# Patient Record
Sex: Female | Born: 1965 | Race: Black or African American | Hispanic: No | Marital: Married | Smoking: Former smoker
Health system: Southern US, Community
[De-identification: ages and names within clinical notes are randomized; demographics above are authoritative.]

## PROBLEM LIST (undated history)

## (undated) DIAGNOSIS — F1910 Other psychoactive substance abuse, uncomplicated: Secondary | ICD-10-CM

## (undated) DIAGNOSIS — G4700 Insomnia, unspecified: Secondary | ICD-10-CM

## (undated) DIAGNOSIS — T7840XA Allergy, unspecified, initial encounter: Secondary | ICD-10-CM

## (undated) DIAGNOSIS — M1990 Unspecified osteoarthritis, unspecified site: Secondary | ICD-10-CM

## (undated) DIAGNOSIS — F1911 Other psychoactive substance abuse, in remission: Secondary | ICD-10-CM

## (undated) DIAGNOSIS — F419 Anxiety disorder, unspecified: Secondary | ICD-10-CM

## (undated) DIAGNOSIS — R011 Cardiac murmur, unspecified: Secondary | ICD-10-CM

## (undated) DIAGNOSIS — G709 Myoneural disorder, unspecified: Secondary | ICD-10-CM

## (undated) DIAGNOSIS — E079 Disorder of thyroid, unspecified: Secondary | ICD-10-CM

## (undated) DIAGNOSIS — F32A Depression, unspecified: Secondary | ICD-10-CM

## (undated) DIAGNOSIS — D649 Anemia, unspecified: Secondary | ICD-10-CM

## (undated) DIAGNOSIS — J45909 Unspecified asthma, uncomplicated: Secondary | ICD-10-CM

## (undated) DIAGNOSIS — K219 Gastro-esophageal reflux disease without esophagitis: Secondary | ICD-10-CM

## (undated) HISTORY — PX: OTHER SURGICAL HISTORY: SHX169

## (undated) HISTORY — DX: Anemia, unspecified: D64.9

## (undated) HISTORY — DX: Depression, unspecified: F32.A

## (undated) HISTORY — DX: Unspecified asthma, uncomplicated: J45.909

## (undated) HISTORY — DX: Cardiac murmur, unspecified: R01.1

## (undated) HISTORY — DX: Other psychoactive substance abuse, in remission: F19.11

## (undated) HISTORY — DX: Other psychoactive substance abuse, uncomplicated: F19.10

## (undated) HISTORY — PX: ABDOMINAL HYSTERECTOMY: SHX81

## (undated) HISTORY — DX: Morbid (severe) obesity due to excess calories: E66.01

## (undated) HISTORY — DX: Gastro-esophageal reflux disease without esophagitis: K21.9

## (undated) HISTORY — DX: Disorder of thyroid, unspecified: E07.9

## (undated) HISTORY — DX: Unspecified osteoarthritis, unspecified site: M19.90

## (undated) HISTORY — DX: Insomnia, unspecified: G47.00

## (undated) HISTORY — PX: PARTIAL HYSTERECTOMY: SHX80

## (undated) HISTORY — DX: Myoneural disorder, unspecified: G70.9

## (undated) HISTORY — DX: Allergy, unspecified, initial encounter: T78.40XA

## (undated) HISTORY — DX: Anxiety disorder, unspecified: F41.9

---

## 2015-11-21 HISTORY — PX: BREAST BIOPSY: SHX20

## 2018-10-16 LAB — BASIC METABOLIC PANEL: Glucose: 103

## 2019-09-08 LAB — HEMOGLOBIN A1C: Hemoglobin A1C: 5.7

## 2019-09-08 LAB — TSH: TSH: 1.84 (ref <=5.90)

## 2020-06-24 ENCOUNTER — Other Ambulatory Visit: Payer: Self-pay

## 2020-06-24 ENCOUNTER — Ambulatory Visit: Payer: 59 | Admitting: Internal Medicine

## 2020-06-24 ENCOUNTER — Encounter: Payer: Self-pay | Admitting: Internal Medicine

## 2020-06-24 DIAGNOSIS — F32A Depression, unspecified: Secondary | ICD-10-CM | POA: Insufficient documentation

## 2020-06-24 DIAGNOSIS — G4700 Insomnia, unspecified: Secondary | ICD-10-CM

## 2020-06-24 DIAGNOSIS — Z803 Family history of malignant neoplasm of breast: Secondary | ICD-10-CM

## 2020-06-24 DIAGNOSIS — Z780 Asymptomatic menopausal state: Secondary | ICD-10-CM

## 2020-06-24 DIAGNOSIS — Z800 Family history of malignant neoplasm of digestive organs: Secondary | ICD-10-CM | POA: Diagnosis not present

## 2020-06-24 DIAGNOSIS — F1911 Other psychoactive substance abuse, in remission: Secondary | ICD-10-CM

## 2020-06-24 DIAGNOSIS — F331 Major depressive disorder, recurrent, moderate: Secondary | ICD-10-CM

## 2020-06-24 MED ORDER — CITALOPRAM HYDROBROMIDE 20 MG PO TABS: 20.0000 mg | ORAL_TABLET | Freq: Every day | ORAL | 1 refills | Status: DC

## 2020-06-24 NOTE — Progress Notes (Signed)
New Patient Office Visit     This visit occurred during the SARS-CoV-2 public health emergency.  Safety protocols were in place, including screening questions prior to the visit, additional usage of staff PPE, and extensive cleaning of exam room while observing appropriate contact time as indicated for disinfecting solutions.    CC/Reason for Visit: Establish care, discuss chronic conditions Previous PCP: In [place] Last Visit: Unknown  HPI: Jill Davis is a 54 y.o. female who is coming in today for the above mentioned reasons. Past Medical History is significant for: Morbid obesity, depression on Celexa.  She is very interested in weight loss management/bariatric surgery referral today.  She is a recovering addict, quit all substances in 1990.  Her family history is very significant for diabetes and hypertension.  She had a sister with breast cancer and her father also had coronary artery disease.  She has received her Covid vaccines.  She is getting her Tdap and shingles at pharmacy next week.  She is overdue for colonoscopy and mammogram.  Also overdue for Pap smear.  She would like a refill of her Celexa today.  She is having difficulty sleeping, does not feel well rested throughout the day, has been told she snores.   Past Medical/Surgical History: Past Medical History:  Diagnosis Date  . Depression   . History of substance abuse ([place])   . Insomnia   . Morbid obesity ([place])     History reviewed. No pertinent surgical history.  Social History:  reports that she has quit smoking. She has never used smokeless tobacco. She reports current alcohol use. She reports current drug use. Drugs: Cocaine, Marijuana, "Crack" cocaine, and Heroin.  Allergies: Allergies  Allergen Reactions  . Celebrex [Celecoxib] Itching    Family History:  Family History  Problem Relation Age of Onset  . Diabetes Mother   . Hypertension Mother   . CAD Father   . Hypertension Father   . Breast  cancer Sister   . Hypertension Brother      Current Outpatient Medications:  .  citalopram (CELEXA) 20 MG tablet, Take 20 mg by mouth daily., Disp: , Rfl:   Review of Systems:  Constitutional: Denies fever, chills, diaphoresis, appetite change and fatigue.  HEENT: Denies photophobia, eye pain, redness, hearing loss, ear pain, congestion, sore throat, rhinorrhea, sneezing, mouth sores, trouble swallowing, neck pain, neck stiffness and tinnitus.   Respiratory: Denies SOB, DOE, cough, chest tightness,  and wheezing.   Cardiovascular: Denies chest pain, palpitations and leg swelling.  Gastrointestinal: Denies nausea, vomiting, abdominal pain, diarrhea, constipation, blood in stool and abdominal distention.  Genitourinary: Denies dysuria, urgency, frequency, hematuria, flank pain and difficulty urinating.  Endocrine: Denies: hot or cold intolerance, sweats, changes in hair or nails, polyuria, polydipsia. Musculoskeletal: Denies myalgias, back pain, joint swelling, arthralgias and gait problem.  Skin: Denies pallor, rash and wound.  Neurological: Denies dizziness, seizures, syncope, weakness, light-headedness, numbness and headaches.  Hematological: Denies adenopathy. Easy bruising, personal or family bleeding history  Psychiatric/Behavioral: Denies suicidal ideation, mood changes, confusion, nervousness, sleep disturbance and agitation    Physical Exam: Vitals:   06/24/20 0937  BP: 124/78  Temp: 98.2 F (36.8 C)  TempSrc: Oral  Weight: 297 lb 11.2 oz (135 kg)  Height: 5' 4.5" (1.638 m)   Body mass index is 50.31 kg/m.  Constitutional: NAD, calm, comfortable, obese Eyes: PERRL, lids and conjunctivae normal ENMT: Mucous membranes are moist.  Respiratory: clear to auscultation bilaterally, no wheezing, no crackles. Normal respiratory  effort. No accessory muscle use.  Cardiovascular: Regular rate and rhythm, no murmurs / rubs / gallops. No extremity edema. Neurologic: Grossly intact  and nonfocal Psychiatric: Normal judgment and insight. Alert and oriented x 3. Normal mood.    Impression and Plan:  Morbid obesity ([place])  -I believe she would be a good candidate for bariatric surgery, will place referral. -Discussed healthy lifestyle, including increased physical activity and better food choices to promote weight loss.  Family history of colon cancer  - Plan: Ambulatory referral to Gastroenterology  Family history of breast cancer  - Plan: MM Digital Screening  Insomnia, unspecified type  - Plan: Ambulatory referral to Neurology for sleep study.  Moderate episode of recurrent major depressive disorder (H[facility]) -Mood is stable, refill Celexa  History of substance abuse ([place]) -Quit 30 years ago.    Patient Instructions  -Nice seeing you today!!  -Schedule follow up for your physical. Please come in fasting that day.  -referrals for colonoscopy, mammogram, sleep study and bariatric clinic have been requested today.     [name], MD [facility]

## 2020-06-24 NOTE — Patient Instructions (Signed)
-  Nice seeing you today!!  -Schedule follow up for your physical. Please come in fasting that day.  -referrals for colonoscopy, mammogram, sleep study and bariatric clinic have been requested today.

## 2020-06-28 ENCOUNTER — Encounter: Payer: Self-pay | Admitting: Obstetrics and Gynecology

## 2020-06-30 ENCOUNTER — Encounter: Payer: Self-pay | Admitting: Gastroenterology

## 2020-06-30 ENCOUNTER — Other Ambulatory Visit: Payer: Self-pay | Admitting: Internal Medicine

## 2020-06-30 DIAGNOSIS — Z1231 Encounter for screening mammogram for malignant neoplasm of breast: Secondary | ICD-10-CM

## 2020-07-06 ENCOUNTER — Other Ambulatory Visit: Payer: Self-pay

## 2020-07-06 ENCOUNTER — Encounter: Payer: Self-pay | Admitting: Internal Medicine

## 2020-07-06 ENCOUNTER — Ambulatory Visit (INDEPENDENT_AMBULATORY_CARE_PROVIDER_SITE_OTHER): Payer: 59 | Admitting: Internal Medicine

## 2020-07-06 VITALS — BP 124/78 | HR 72 | Temp 98.1°F | Ht 64.5 in | Wt 295.4 lb

## 2020-07-06 DIAGNOSIS — Z0000 Encounter for general adult medical examination without abnormal findings: Secondary | ICD-10-CM

## 2020-07-06 DIAGNOSIS — F331 Major depressive disorder, recurrent, moderate: Secondary | ICD-10-CM

## 2020-07-06 NOTE — Progress Notes (Signed)
Established Patient Office Visit     This visit occurred during the SARS-CoV-2 public health emergency.  Safety protocols were in place, including screening questions prior to the visit, additional usage of staff PPE, and extensive cleaning of exam room while observing appropriate contact time as indicated for disinfecting solutions.    CC/Reason for Visit: Annual preventive exam  HPI: Jill Davis is a 54 y.o. female who is coming in today for the above mentioned reasons. Past Medical History is significant for: Depression on Celexa, morbid obesity.  She has routine eye care but no dental care.  Her immunizations are up-to-date, she has scheduled her GYN exam for later this week, mammogram for the end of the month and her colonoscopy scheduled for next month.  She is also scheduling her appointment with the bariatric clinic.  She has no acute complaints today.   Past Medical/Surgical History: Past Medical History:  Diagnosis Date  . Depression   . History of substance abuse ([place])   . Insomnia   . Morbid obesity ([place])     No past surgical history on file.  Social History:  reports that she has quit smoking. She has never used smokeless tobacco. She reports current alcohol use. She reports current drug use. Drugs: Cocaine, Marijuana, "Crack" cocaine, and Heroin.  Allergies: Allergies  Allergen Reactions  . Celebrex [Celecoxib] Itching    Family History:  Family History  Problem Relation Age of Onset  . Diabetes Mother   . Hypertension Mother   . CAD Father   . Hypertension Father   . Breast cancer Sister   . Hypertension Brother      Current Outpatient Medications:  .  citalopram (CELEXA) 20 MG tablet, Take 1 tablet (20 mg total) by mouth daily., Disp: 90 tablet, Rfl: 1  Review of Systems:  Constitutional: Denies fever, chills, diaphoresis, appetite change and fatigue.  HEENT: Denies photophobia, eye pain, redness, hearing loss, ear pain, congestion, sore  throat, rhinorrhea, sneezing, mouth sores, trouble swallowing, neck pain, neck stiffness and tinnitus.   Respiratory: Denies SOB, DOE, cough, chest tightness,  and wheezing.   Cardiovascular: Denies chest pain, palpitations and leg swelling.  Gastrointestinal: Denies nausea, vomiting, abdominal pain, diarrhea, constipation, blood in stool and abdominal distention.  Genitourinary: Denies dysuria, urgency, frequency, hematuria, flank pain and difficulty urinating.  Endocrine: Denies: hot or cold intolerance, sweats, changes in hair or nails, polyuria, polydipsia. Musculoskeletal: Denies myalgias, back pain, joint swelling, arthralgias and gait problem.  Skin: Denies pallor, rash and wound.  Neurological: Denies dizziness, seizures, syncope, weakness, light-headedness, numbness and headaches.  Hematological: Denies adenopathy. Easy bruising, personal or family bleeding history  Psychiatric/Behavioral: Denies suicidal ideation, mood changes, confusion, nervousness, sleep disturbance and agitation    Physical Exam: Vitals:   07/06/20 0706  BP: 124/78  Pulse: 72  Temp: 98.1 F (36.7 C)  TempSrc: Oral  SpO2: 95%  Weight: 295 lb 6.4 oz (134 kg)  Height: 5' 4.5" (1.638 m)    Body mass index is 49.92 kg/m.   Constitutional: NAD, calm, comfortable, obese Eyes: PERRL, lids and conjunctivae normal ENMT: Mucous membranes are moist.Tympanic membrane is pearly white, no erythema or bulging. Neck: normal, supple, no masses, no thyromegaly Respiratory: clear to auscultation bilaterally, no wheezing, no crackles. Normal respiratory effort. No accessory muscle use.  Cardiovascular: Regular rate and rhythm, no murmurs / rubs / gallops. No extremity edema. 2+ pedal pulses.  Abdomen: no tenderness, no masses palpated. No hepatosplenomegaly. Bowel sounds positive.  Musculoskeletal:  no clubbing / cyanosis. No joint deformity upper and lower extremities. Good ROM, no contractures. Normal muscle tone.    Skin: no rashes, lesions, ulcers. No induration Neurologic: CN 2-12 grossly intact. Sensation intact, DTR normal. Strength 5/5 in all 4.  Psychiatric: Normal judgment and insight. Alert and oriented x 3. Normal mood.    Impression and Plan:  Encounter for preventive health examination  -She has routine eye care, have advised routine dental care. -Immunizations are up-to-date. -Healthy lifestyle discussed in detail. -Screening labs today. -Her colonoscopy, mammogram, and GYN evaluation are scheduled for later this month.  Morbid obesity ([place]) -Discussed healthy lifestyle, including increased physical activity and better food choices to promote weight loss.  Moderate episode of recurrent major depressive disorder ([place]) -   Office Visit from 07/06/2020 in [place] at [place]  PHQ-9 Total Score 4     -Mood is stable on Celexa.    Patient Instructions  -Nice seeing you today!!  -Lab work today; will notify you once results are available.  -See you back in 1 year or sooner as needed.   Preventive Care 33-45 Years Old, Female Preventive care refers to visits with your health care provider and lifestyle choices that can promote health and wellness. This includes:  A yearly physical exam. This may also be called an annual well check.  Regular dental visits and eye exams.  Immunizations.  Screening for certain conditions.  Healthy lifestyle choices, such as eating a healthy diet, getting regular exercise, not using drugs or products that contain nicotine and tobacco, and limiting alcohol use. What can I expect for my preventive care visit? Physical exam Your health care provider will check your:  Height and weight. This may be used to calculate body mass index (BMI), which tells if you are at a healthy weight.  Heart rate and blood pressure.  Skin for abnormal spots. Counseling Your health care provider may ask you questions about your:  Alcohol,  tobacco, and drug use.  Emotional well-being.  Home and relationship well-being.  Sexual activity.  Eating habits.  Work and work Statistician.  Method of birth control.  Menstrual cycle.  Pregnancy history. What immunizations do I need?  Influenza (flu) vaccine  This is recommended every year. Tetanus, diphtheria, and pertussis (Tdap) vaccine  You may need a Td booster every 10 years. Varicella (chickenpox) vaccine  You may need this if you have not been vaccinated. Zoster (shingles) vaccine  You may need this after age 62. Measles, mumps, and rubella (MMR) vaccine  You may need at least one dose of MMR if you were born in 1957 or later. You may also need a second dose. Pneumococcal conjugate (PCV13) vaccine  You may need this if you have certain conditions and were not previously vaccinated. Pneumococcal polysaccharide (PPSV23) vaccine  You may need one or two doses if you smoke cigarettes or if you have certain conditions. Meningococcal conjugate (MenACWY) vaccine  You may need this if you have certain conditions. Hepatitis A vaccine  You may need this if you have certain conditions or if you travel or work in places where you may be exposed to hepatitis A. Hepatitis B vaccine  You may need this if you have certain conditions or if you travel or work in places where you may be exposed to hepatitis B. Haemophilus influenzae type b (Hib) vaccine  You may need this if you have certain conditions. Human papillomavirus (HPV) vaccine  If recommended by your health care provider, you  may need three doses over 6 months. You may receive vaccines as individual doses or as more than one vaccine together in one shot (combination vaccines). Talk with your health care provider about the risks and benefits of combination vaccines. What tests do I need? Blood tests  Lipid and cholesterol levels. These may be checked every 5 years, or more frequently if you are over 56  years old.  Hepatitis C test.  Hepatitis B test. Screening  Lung cancer screening. You may have this screening every year starting at age 86 if you have a 30-pack-year history of smoking and currently smoke or have quit within the past 15 years.  Colorectal cancer screening. All adults should have this screening starting at age 31 and continuing until age 33. Your health care provider may recommend screening at age 40 if you are at increased risk. You will have tests every 1-10 years, depending on your results and the type of screening test.  Diabetes screening. This is done by checking your blood sugar (glucose) after you have not eaten for a while (fasting). You may have this done every 1-3 years.  Mammogram. This may be done every 1-2 years. Talk with your health care provider about when you should start having regular mammograms. This may depend on whether you have a family history of breast cancer.  BRCA-related cancer screening. This may be done if you have a family history of breast, ovarian, tubal, or peritoneal cancers.  Pelvic exam and Pap test. This may be done every 3 years starting at age 25. Starting at age 75, this may be done every 5 years if you have a Pap test in combination with an HPV test. Other tests  Sexually transmitted disease (STD) testing.  Bone density scan. This is done to screen for osteoporosis. You may have this scan if you are at high risk for osteoporosis. Follow these instructions at home: Eating and drinking  Eat a diet that includes fresh fruits and vegetables, whole grains, lean protein, and low-fat dairy.  Take vitamin and mineral supplements as recommended by your health care provider.  Do not drink alcohol if: ? Your health care provider tells you not to drink. ? You are pregnant, may be pregnant, or are planning to become pregnant.  If you drink alcohol: ? Limit how much you have to 0-1 drink a day. ? Be aware of how much alcohol is in  your drink. In the [place] one drink equals one 12 oz bottle of beer (355 mL), one 5 oz glass of wine (148 mL), or one 1 oz glass of hard liquor (44 mL). Lifestyle  Take daily care of your teeth and gums.  Stay active. Exercise for at least 30 minutes on 5 or more days each week.  Do not use any products that contain nicotine or tobacco, such as cigarettes, e-cigarettes, and chewing tobacco. If you need help quitting, ask your health care provider.  If you are sexually active, practice safe sex. Use a condom or other form of birth control (contraception) in order to prevent pregnancy and STIs (sexually transmitted infections).  If told by your health care provider, take low-dose aspirin daily starting at age 55. What's next?  Visit your health care provider once a year for a well check visit.  Ask your health care provider how often you should have your eyes and teeth checked.  Stay up to date on all vaccines. This information is not intended to replace advice given to you  by your health care provider. Make sure you discuss any questions you have with your health care provider. Document Revised: 07/18/2018 Document Reviewed: 07/18/2018 Elsevier Patient Education  2020 [place], MD [facility]

## 2020-07-06 NOTE — Patient Instructions (Signed)
-Nice seeing you today!!  -Lab work today; will notify you once results are available.  -See you back in 1 year or sooner as needed.   Preventive Care 55-53 Years Old, Female Preventive care refers to visits with your health care provider and lifestyle choices that can promote health and wellness. This includes:  A yearly physical exam. This may also be called an annual well check.  Regular dental visits and eye exams.  Immunizations.  Screening for certain conditions.  Healthy lifestyle choices, such as eating a healthy diet, getting regular exercise, not using drugs or products that contain nicotine and tobacco, and limiting alcohol use. What can I expect for my preventive care visit? Physical exam Your health care provider will check your:  Height and weight. This may be used to calculate body mass index (BMI), which tells if you are at a healthy weight.  Heart rate and blood pressure.  Skin for abnormal spots. Counseling Your health care provider may ask you questions about your:  Alcohol, tobacco, and drug use.  Emotional well-being.  Home and relationship well-being.  Sexual activity.  Eating habits.  Work and work Statistician.  Method of birth control.  Menstrual cycle.  Pregnancy history. What immunizations do I need?  Influenza (flu) vaccine  This is recommended every year. Tetanus, diphtheria, and pertussis (Tdap) vaccine  You may need a Td booster every 10 years. Varicella (chickenpox) vaccine  You may need this if you have not been vaccinated. Zoster (shingles) vaccine  You may need this after age 84. Measles, mumps, and rubella (MMR) vaccine  You may need at least one dose of MMR if you were born in 1957 or later. You may also need a second dose. Pneumococcal conjugate (PCV13) vaccine  You may need this if you have certain conditions and were not previously vaccinated. Pneumococcal polysaccharide (PPSV23) vaccine  You may need one or  two doses if you smoke cigarettes or if you have certain conditions. Meningococcal conjugate (MenACWY) vaccine  You may need this if you have certain conditions. Hepatitis A vaccine  You may need this if you have certain conditions or if you travel or work in places where you may be exposed to hepatitis A. Hepatitis B vaccine  You may need this if you have certain conditions or if you travel or work in places where you may be exposed to hepatitis B. Haemophilus influenzae type b (Hib) vaccine  You may need this if you have certain conditions. Human papillomavirus (HPV) vaccine  If recommended by your health care provider, you may need three doses over 6 months. You may receive vaccines as individual doses or as more than one vaccine together in one shot (combination vaccines). Talk with your health care provider about the risks and benefits of combination vaccines. What tests do I need? Blood tests  Lipid and cholesterol levels. These may be checked every 5 years, or more frequently if you are over 84 years old.  Hepatitis C test.  Hepatitis B test. Screening  Lung cancer screening. You may have this screening every year starting at age 53 if you have a 30-pack-year history of smoking and currently smoke or have quit within the past 15 years.  Colorectal cancer screening. All adults should have this screening starting at age 41 and continuing until age 83. Your health care provider may recommend screening at age 36 if you are at increased risk. You will have tests every 1-10 years, depending on your results and the type  of screening test.  Diabetes screening. This is done by checking your blood sugar (glucose) after you have not eaten for a while (fasting). You may have this done every 1-3 years.  Mammogram. This may be done every 1-2 years. Talk with your health care provider about when you should start having regular mammograms. This may depend on whether you have a family history  of breast cancer.  BRCA-related cancer screening. This may be done if you have a family history of breast, ovarian, tubal, or peritoneal cancers.  Pelvic exam and Pap test. This may be done every 3 years starting at age 21. Starting at age 30, this may be done every 5 years if you have a Pap test in combination with an HPV test. Other tests  Sexually transmitted disease (STD) testing.  Bone density scan. This is done to screen for osteoporosis. You may have this scan if you are at high risk for osteoporosis. Follow these instructions at home: Eating and drinking  Eat a diet that includes fresh fruits and vegetables, whole grains, lean protein, and low-fat dairy.  Take vitamin and mineral supplements as recommended by your health care provider.  Do not drink alcohol if: ? Your health care provider tells you not to drink. ? You are pregnant, may be pregnant, or are planning to become pregnant.  If you drink alcohol: ? Limit how much you have to 0-1 drink a day. ? Be aware of how much alcohol is in your drink. In the [place] one drink equals one 12 oz bottle of beer (355 mL), one 5 oz glass of wine (148 mL), or one 1 oz glass of hard liquor (44 mL). Lifestyle  Take daily care of your teeth and gums.  Stay active. Exercise for at least 30 minutes on 5 or more days each week.  Do not use any products that contain nicotine or tobacco, such as cigarettes, e-cigarettes, and chewing tobacco. If you need help quitting, ask your health care provider.  If you are sexually active, practice safe sex. Use a condom or other form of birth control (contraception) in order to prevent pregnancy and STIs (sexually transmitted infections).  If told by your health care provider, take low-dose aspirin daily starting at age 50. What's next?  Visit your health care provider once a year for a well check visit.  Ask your health care provider how often you should have your eyes and teeth checked.  Stay up  to date on all vaccines. This information is not intended to replace advice given to you by your health care provider. Make sure you discuss any questions you have with your health care provider. Document Revised: 07/18/2018 Document Reviewed: 07/18/2018 Elsevier Patient Education  2020 [employer] Inc[employer]  

## 2020-07-07 ENCOUNTER — Encounter: Payer: Self-pay | Admitting: Internal Medicine

## 2020-07-07 LAB — VITAMIN B12: Vitamin B-12: 384 pg/mL (ref 200–1100)

## 2020-07-07 LAB — CBC WITH DIFFERENTIAL/PLATELET
Absolute Monocytes: 398 cells/uL (ref 200–950)
Basophils Absolute: 49 cells/uL (ref 0–200)
Basophils Relative: 0.5 %
Eosinophils Absolute: 126 cells/uL (ref 15–500)
Eosinophils Relative: 1.3 %
HCT: 39.1 % (ref 35.0–45.0)
Hemoglobin: 12.7 g/dL (ref 11.7–15.5)
Lymphs Abs: 2939 cells/uL (ref 850–3900)
MCH: 27.5 pg (ref 27.0–33.0)
MCHC: 32.5 g/dL (ref 32.0–36.0)
MCV: 84.6 fL (ref 80.0–100.0)
MPV: 10.3 fL (ref 7.5–12.5)
Monocytes Relative: 4.1 %
Neutro Abs: 6189 cells/uL (ref 1500–7800)
Neutrophils Relative %: 63.8 %
Platelets: 289 10*3/uL (ref 140–400)
RBC: 4.62 10*6/uL (ref 3.80–5.10)
RDW: 14.9 % (ref 11.0–15.0)
Total Lymphocyte: 30.3 %
WBC: 9.7 10*3/uL (ref 3.8–10.8)

## 2020-07-07 LAB — LIPID PANEL
Cholesterol: 118 mg/dL (ref <200)
HDL: 30 mg/dL — ABNORMAL LOW (ref >=50)
LDL Cholesterol (Calc): 69 mg/dL (calc)
Non-HDL Cholesterol (Calc): 88 mg/dL (calc) (ref <130)
Total CHOL/HDL Ratio: 3.9 (calc) (ref <5.0)
Triglycerides: 103 mg/dL (ref <150)

## 2020-07-07 LAB — COMPREHENSIVE METABOLIC PANEL
AG Ratio: 1.4 (calc) (ref 1.0–2.5)
ALT: 17 U/L (ref 6–29)
AST: 17 U/L (ref 10–35)
Albumin: 3.9 g/dL (ref 3.6–5.1)
Alkaline phosphatase (APISO): 122 U/L (ref 37–153)
BUN: 12 mg/dL (ref 7–25)
CO2: 25 mmol/L (ref 20–32)
Calcium: 9 mg/dL (ref 8.6–10.4)
Chloride: 105 mmol/L (ref 98–110)
Creat: 0.75 mg/dL (ref 0.50–1.05)
Globulin: 2.8 g/dL (calc) (ref 1.9–3.7)
Glucose, Bld: 103 mg/dL — ABNORMAL HIGH (ref 65–99)
Potassium: 4 mmol/L (ref 3.5–5.3)
Sodium: 140 mmol/L (ref 135–146)
Total Bilirubin: 0.4 mg/dL (ref 0.2–1.2)
Total Protein: 6.7 g/dL (ref 6.1–8.1)

## 2020-07-07 LAB — VITAMIN D 25 HYDROXY (VIT D DEFICIENCY, FRACTURES): Vit D, 25-Hydroxy: 30 ng/mL (ref 30–100)

## 2020-07-07 LAB — HEMOGLOBIN A1C
Hgb A1c MFr Bld: 5.9 % of total Hgb — ABNORMAL HIGH (ref <5.7)
Mean Plasma Glucose: 123 (calc)
eAG (mmol/L): 6.8 (calc)

## 2020-07-07 LAB — TSH: TSH: 2.27 mIU/L

## 2020-07-08 ENCOUNTER — Encounter: Payer: Self-pay | Admitting: Internal Medicine

## 2020-07-08 ENCOUNTER — Other Ambulatory Visit: Payer: Self-pay | Admitting: Internal Medicine

## 2020-07-08 ENCOUNTER — Telehealth: Payer: Self-pay | Admitting: Internal Medicine

## 2020-07-08 DIAGNOSIS — E559 Vitamin D deficiency, unspecified: Secondary | ICD-10-CM

## 2020-07-08 MED ORDER — VITAMIN D (ERGOCALCIFEROL) 1.25 MG (50000 UNIT) PO CAPS: 50000.0000 [IU] | ORAL_CAPSULE | ORAL | 0 refills | Status: AC

## 2020-07-08 NOTE — Telephone Encounter (Signed)
Pt called to say she doesn't remember what the medication she is picking up is for.  Said she was half sleep when she got the call to go pick it up. Wants a call back  [phone]  Please advise

## 2020-07-08 NOTE — Telephone Encounter (Signed)
Reviewed lab results with patient.

## 2020-07-09 ENCOUNTER — Ambulatory Visit: Payer: 59 | Admitting: Obstetrics and Gynecology

## 2020-07-19 ENCOUNTER — Encounter: Payer: Self-pay | Admitting: Internal Medicine

## 2020-07-20 ENCOUNTER — Other Ambulatory Visit: Payer: Self-pay

## 2020-07-20 ENCOUNTER — Encounter (HOSPITAL_COMMUNITY): Payer: Self-pay

## 2020-07-20 ENCOUNTER — Emergency Department (HOSPITAL_COMMUNITY)
Admission: EM | Admit: 2020-07-20 | Discharge: 2020-07-21 | Disposition: A | Discharge status: Home / Self Care | Payer: 59 | Attending: Emergency Medicine | Admitting: Emergency Medicine

## 2020-07-20 ENCOUNTER — Ambulatory Visit
Admission: RE | Admit: 2020-07-20 | Discharge: 2020-07-20 | Disposition: A | Discharge status: Home / Self Care | Payer: 59 | Source: Ambulatory Visit | Attending: Internal Medicine | Admitting: Internal Medicine

## 2020-07-20 DIAGNOSIS — Y999 Unspecified external cause status: Secondary | ICD-10-CM | POA: Insufficient documentation

## 2020-07-20 DIAGNOSIS — Y929 Unspecified place or not applicable: Secondary | ICD-10-CM | POA: Insufficient documentation

## 2020-07-20 DIAGNOSIS — Y9389 Activity, other specified: Secondary | ICD-10-CM | POA: Diagnosis not present

## 2020-07-20 DIAGNOSIS — Z87891 Personal history of nicotine dependence: Secondary | ICD-10-CM | POA: Insufficient documentation

## 2020-07-20 DIAGNOSIS — S70312A Abrasion, left thigh, initial encounter: Secondary | ICD-10-CM | POA: Diagnosis present

## 2020-07-20 DIAGNOSIS — Z79899 Other long term (current) drug therapy: Secondary | ICD-10-CM | POA: Insufficient documentation

## 2020-07-20 DIAGNOSIS — Z1231 Encounter for screening mammogram for malignant neoplasm of breast: Secondary | ICD-10-CM

## 2020-07-20 DIAGNOSIS — W5503XA Scratched by cat, initial encounter: Secondary | ICD-10-CM | POA: Diagnosis not present

## 2020-07-20 NOTE — ED Notes (Signed)
Pressure bandage applied to wound

## 2020-07-20 NOTE — ED Provider Notes (Signed)
[place] DEPT Provider Note   CSN: [id number] Arrival date & time: 07/20/20  2218     History No chief complaint on file.   Jill Davis is a 54 y.o. female.  HPI     This is a 54 year old female who presents with a cat scratch.  She states that she was trying to catch a moth when it landed on her leg.  Her cat bounced on her leg scratching her leg.  She was not bitten.  She noted significant bleeding of the left leg.  Her tetanus is up-to-date and her cats vaccinations are up-to-date.  She reports significant bleeding.  No significant pain.  Past Medical History:  Diagnosis Date  . Depression   . History of substance abuse ([place])   . Insomnia   . Morbid obesity [facility])     Patient Active Problem List   Diagnosis Date Noted  . Vitamin D deficiency 07/08/2020  . Morbid obesity ([place])   . Depression   . Insomnia   . History of substance abuse [facility])     Past Surgical History:  Procedure Laterality Date  . BREAST BIOPSY Left 11/21/2015   benign     OB History   No obstetric history on file.     Family History  Problem Relation Age of Onset  . Diabetes Mother   . Hypertension Mother   . CAD Father   . Hypertension Father   . Breast cancer Sister   . Hypertension Brother     Social History   Tobacco Use  . Smoking status: Former Research scientist (life sciences)  . Smokeless tobacco: Never Used  Substance Use Topics  . Alcohol use: Yes    Comment: occasional  . Drug use: Yes    Types: Cocaine, Marijuana, "Crack" cocaine, Heroin    Comment: quit 1990    Home Medications Prior to Admission medications   Medication Sig Start Date End Date Taking? Authorizing Provider  citalopram (CELEXA) 20 MG tablet Take 1 tablet (20 mg total) by mouth daily. 06/24/20   Isaac Bliss, Rayford Halsted, MD  Vitamin D, Ergocalciferol, (DRISDOL) 1.25 MG (50000 UNIT) CAPS capsule Take 1 capsule (50,000 Units total) by mouth every 7 (seven) days for 12 doses. 07/08/20 09/24/20   Erline Hau, MD    Allergies    Celebrex [celecoxib]  Review of Systems   Review of Systems  Constitutional: Negative for fever.  Skin: Positive for wound. Negative for color change.  All other systems reviewed and are negative.   Physical Exam Updated Vital Signs BP 128/77   Pulse 77   Temp 98.2 F (36.8 C) (Oral)   Resp 18   SpO2 97%   Physical Exam Vitals and nursing note reviewed.  Constitutional:      Appearance: She is well-developed. She is obese.  HENT:     Head: Normocephalic and atraumatic.     Nose: Nose normal.     Mouth/Throat:     Mouth: Mucous membranes are moist.  Eyes:     Pupils: Pupils are equal, round, and reactive to light.  Cardiovascular:     Rate and Rhythm: Normal rate and regular rhythm.  Pulmonary:     Effort: Pulmonary effort is normal. No respiratory distress.  Musculoskeletal:     Cervical back: Neck supple.     Right lower leg: No edema.     Left lower leg: No edema.  Skin:    General: Skin is warm and dry.  Comments: Punctate scratch noted over the left lateral thigh, no adjacent erythema, superficial, nonbleeding at this time although bandage noted to have a fair amount of blood on it  Neurological:     Mental Status: She is alert and oriented to person, place, and time.  Psychiatric:        Mood and Affect: Mood normal.     ED Results / Procedures / Treatments   Labs (all labs ordered are listed, but only abnormal results are displayed) Labs Reviewed - No data to display  EKG None  Radiology No results found.  Procedures Procedures (including critical care time)  Medications Ordered in ED Medications - No data to display  ED Course  I have reviewed the triage vital signs and the nursing notes.  Pertinent labs & imaging results that were available during my care of the patient were reviewed by me and considered in my medical decision making (see chart for details).    MDM  Rules/Calculators/A&P                           Patient presents with a scratch to the left thigh.  Reports significant bleeding.  Bleeding has stopped on my evaluation and actual wound is very small.  This was cleaned copiously.  Additional pressure dressing was applied as she is likely nicked a small vein.  We will keep that in place.  This will likely heal without incident.  Patient was informed that she could be at risk for cat scratch disease if she develops infectious symptoms.  After history, exam, and medical workup I feel the patient has been appropriately medically screened and is safe for discharge home. Pertinent diagnoses were discussed with the patient. Patient was given return precautions.   Final Clinical Impression(s) / ED Diagnoses Final diagnoses:  Cat scratch    Rx / DC Orders ED Discharge Orders    None       Nnaemeka Samson, Barbette Hair, MD 07/20/20 2350

## 2020-07-20 NOTE — ED Triage Notes (Signed)
Patient arrived stating her cat scratched her left leg prior to arrival. Reports cat is up to date on all shots and she and her tetanus shot about two weeks ago.

## 2020-07-20 NOTE — Discharge Instructions (Signed)
You were seen today after your cat scratched you.  This will likely heal well on its own.  Keep a compressive dressing on for the next 24 hours to prevent rebleeding.  If you notice redness, streaking, any signs or symptoms of infection, you may need an antibiotic.  Cat scratch can put you at risk for cat scratch disease.

## 2020-07-28 ENCOUNTER — Ambulatory Visit: Payer: 59 | Admitting: Obstetrics and Gynecology

## 2020-08-17 ENCOUNTER — Telehealth: Payer: Self-pay | Admitting: *Deleted

## 2020-08-17 ENCOUNTER — Other Ambulatory Visit: Payer: Self-pay

## 2020-08-17 ENCOUNTER — Ambulatory Visit (AMBULATORY_SURGERY_CENTER): Payer: 59 | Admitting: *Deleted

## 2020-08-17 VITALS — Ht 64.5 in | Wt 300.0 lb

## 2020-08-17 DIAGNOSIS — Z1211 Encounter for screening for malignant neoplasm of colon: Secondary | ICD-10-CM

## 2020-08-17 MED ORDER — SUTAB 1479-225-188 MG PO TABS: 24.0000 | ORAL_TABLET | ORAL | 0 refills | Status: DC

## 2020-08-17 NOTE — Progress Notes (Signed)
cov vax x 2 No egg or soy allergy known to patient  No issues with past sedation with any surgeries or procedures no intubation problems in the past  No FH of Malignant Hyperthermia No diet pills per patient No home 02 use per patient  No blood thinners per patient  Pt denies issues with constipation  No A fib or A flutter  EMMI video to pt or via [place] 19 guidelines implemented in PV today with Pt and RN   Pt's BMI at PV 50.70- did PV- note to Dr Tarri Glenn- ? Ov ? Direct at [place] given to pt in [place] today , Code to Pharmacy   Due to the COVID-19 pandemic we are asking patients to follow these guidelines. Please only bring one care partner. Please be aware that your care partner may wait in the car in the parking lot or if they feel like they will be too hot to wait in the car, they may wait in the lobby on the 4th floor. All care partners are required to wear a mask the entire time (we do not have any that we can provide them), they need to practice social distancing, and we will do a Covid check for all patient's and care partners when you arrive. Also we will check their temperature and your temperature. If the care partner waits in their car they need to stay in the parking lot the entire time and we will call them on their cell phone when the patient is ready for discharge so they can bring the car to the front of the building. Also all patient's will need to wear a mask into building.

## 2020-08-17 NOTE — Telephone Encounter (Signed)
Dr Tarri Glenn,  I saw thi spt in [place] today- she is a direct colon screen-  Her weight is 300.0ln abd her height is 5 4.5 inches - her BMI today is 50.70-  No previous colon- she has ahx of anx/ dep, Hx IDA, asthma, GERD, murmur as baby, hx of substance abuse ( clean > 30 yrs) thyroid dx   Do you want her to have an OV or can she be direct at [facility]   Please advise- thanks so much for your time, Marijean Niemann

## 2020-08-18 ENCOUNTER — Encounter: Payer: Self-pay | Admitting: Gastroenterology

## 2020-08-18 ENCOUNTER — Other Ambulatory Visit: Payer: Self-pay

## 2020-08-18 DIAGNOSIS — Z1211 Encounter for screening for malignant neoplasm of colon: Secondary | ICD-10-CM

## 2020-08-18 NOTE — Telephone Encounter (Signed)
Direct to [facility]. Thank you.

## 2020-08-18 NOTE — Telephone Encounter (Signed)
Pt scheduled for covid screen 10/01/20 at 9:45am, colon scheduled at [facility] 11/16@10 :30am. Is the pt expecting a call from you Jill Davis or do I need to call them.

## 2020-08-18 NOTE — Telephone Encounter (Signed)
Patient called and given [facility]  colonoscopy date and new instructions sent to mychart. Pt is aware.

## 2020-08-21 ENCOUNTER — Encounter: Payer: Self-pay | Admitting: Internal Medicine

## 2020-08-31 ENCOUNTER — Encounter: Payer: 59 | Admitting: Gastroenterology

## 2020-08-31 ENCOUNTER — Other Ambulatory Visit (HOSPITAL_COMMUNITY): Payer: 59

## 2020-09-23 ENCOUNTER — Encounter: Payer: Self-pay | Admitting: Internal Medicine

## 2020-10-01 ENCOUNTER — Other Ambulatory Visit (HOSPITAL_COMMUNITY)
Admission: RE | Admit: 2020-10-01 | Discharge: 2020-10-01 | Disposition: A | Discharge status: Home / Self Care | Payer: 59 | Source: Ambulatory Visit | Attending: Gastroenterology | Admitting: Gastroenterology

## 2020-10-01 DIAGNOSIS — Z01812 Encounter for preprocedural laboratory examination: Secondary | ICD-10-CM | POA: Diagnosis present

## 2020-10-01 DIAGNOSIS — Z20822 Contact with and (suspected) exposure to covid-19: Secondary | ICD-10-CM | POA: Insufficient documentation

## 2020-10-01 LAB — SARS CORONAVIRUS 2 (TAT 6-24 HRS): SARS Coronavirus 2: NEGATIVE

## 2020-10-05 ENCOUNTER — Other Ambulatory Visit: Payer: Self-pay

## 2020-10-05 ENCOUNTER — Ambulatory Visit (HOSPITAL_COMMUNITY)
Admission: RE | Admit: 2020-10-05 | Discharge: 2020-10-05 | Disposition: A | Discharge status: Home / Self Care | Payer: 59 | Attending: Gastroenterology | Admitting: Gastroenterology

## 2020-10-05 ENCOUNTER — Ambulatory Visit (HOSPITAL_COMMUNITY): Payer: 59 | Admitting: Anesthesiology

## 2020-10-05 ENCOUNTER — Encounter (HOSPITAL_COMMUNITY)
Admission: RE | Disposition: A | Discharge status: Home / Self Care | Payer: Self-pay | Source: Home / Self Care | Attending: Gastroenterology

## 2020-10-05 ENCOUNTER — Encounter (HOSPITAL_COMMUNITY): Payer: Self-pay | Admitting: Gastroenterology

## 2020-10-05 DIAGNOSIS — Z87891 Personal history of nicotine dependence: Secondary | ICD-10-CM | POA: Insufficient documentation

## 2020-10-05 DIAGNOSIS — Q438 Other specified congenital malformations of intestine: Secondary | ICD-10-CM | POA: Diagnosis not present

## 2020-10-05 DIAGNOSIS — K635 Polyp of colon: Secondary | ICD-10-CM

## 2020-10-05 DIAGNOSIS — Z1211 Encounter for screening for malignant neoplasm of colon: Secondary | ICD-10-CM | POA: Diagnosis present

## 2020-10-05 HISTORY — PX: COLONOSCOPY WITH PROPOFOL: SHX5780

## 2020-10-05 HISTORY — PX: POLYPECTOMY: SHX5525

## 2020-10-05 SURGERY — COLONOSCOPY WITH PROPOFOL
Anesthesia: Monitor Anesthesia Care

## 2020-10-05 MED ORDER — LACTATED RINGERS IV SOLN
INTRAVENOUS | Status: DC
  Administered 2020-10-05: 1000 mL via INTRAVENOUS

## 2020-10-05 MED ORDER — PROPOFOL 10 MG/ML IV BOLUS
INTRAVENOUS | Status: DC | PRN
  Administered 2020-10-05: 60 mg via INTRAVENOUS

## 2020-10-05 MED ORDER — PROPOFOL 500 MG/50ML IV EMUL
INTRAVENOUS | Status: DC | PRN
  Administered 2020-10-05: 140 ug/kg/min via INTRAVENOUS

## 2020-10-05 MED ORDER — SODIUM CHLORIDE 0.9 % IV SOLN: INTRAVENOUS | Status: DC

## 2020-10-05 SURGICAL SUPPLY — 21 items

## 2020-10-05 NOTE — Op Note (Signed)
[facility] Patient Name: Jill Davis Procedure Date: 10/05/2020 MRN: [id number] Attending MD: Thornton Park MD, MD Date of Birth: 25-May-1966 CSN: [id number] Age: 54 Admit Type: Outpatient Procedure:                Colonoscopy Indications:              Screening for colorectal malignant neoplasm, This                            is the patient's first colonoscopy                           No known family history of colon cancer or polyps Providers:                Thornton Park MD, MD, Glori Bickers, RN, Cherylynn Ridges, Technician, Stephanie [place] CRNA Referring MD:              Medicines:                Monitored Anesthesia Care Complications:            No immediate complications. Estimated blood loss:                            Minimal. Estimated Blood Loss:     Estimated blood loss was minimal. Procedure:                Pre-Anesthesia Assessment:                           - Prior to the procedure, a History and Physical                            was performed, and patient medications and                            allergies were reviewed. The patient's tolerance of                            previous anesthesia was also reviewed. The risks                            and benefits of the procedure and the sedation                            options and risks were discussed with the patient.                            All questions were answered, and informed consent                            was obtained. Prior Anticoagulants: The patient has  taken no previous anticoagulant or antiplatelet                            agents. ASA Grade Assessment: III - A patient with                            severe systemic disease. After reviewing the risks                            and benefits, the patient was deemed in                            satisfactory condition to undergo the procedure.                            After obtaining informed consent, the colonoscope                            was passed under direct vision. Throughout the                            procedure, the patient's blood pressure, pulse, and                            oxygen saturations were monitored continuously. The                            CF-HQ190L ([id number] Olympus colonoscope was                            introduced through the anus and advanced to the the                            cecum, identified by appendiceal orifice and                            ileocecal valve. The colonoscopy was performed with                            moderate difficulty due to a redundant colon and                            significant looping. Successful completion of the                            procedure was aided by applying abdominal pressure.                            The patient tolerated the procedure well. The                            quality of the bowel preparation was good. The  ileocecal valve, appendiceal orifice, and rectum                            were photographed. Scope In: 10:49:28 AM Scope Out: 11:04:43 AM Scope Withdrawal Time: 0 hours 11 minutes 13 seconds  Total Procedure Duration: 0 hours 15 minutes 15 seconds  Findings:      The perianal and digital rectal examinations were normal.      A 2 mm polyp was found in the proximal descending colon. The polyp was       sessile. The polyp was removed with a cold snare. Resection and       retrieval were complete. Estimated blood loss was minimal.      The exam was otherwise without abnormality on direct and retroflexion       views. Impression:               - One 2 mm polyp in the proximal descending colon,                            removed with a cold snare. Resected and retrieved.                           - The examination was otherwise normal on direct                            and retroflexion views. Moderate Sedation:       Not Applicable - Patient had care per Anesthesia. Recommendation:           - Patient has a contact number available for                            emergencies. The signs and symptoms of potential                            delayed complications were discussed with the                            patient. Return to normal activities tomorrow.                            Written discharge instructions were provided to the                            patient.                           - Resume previous diet.                           - Continue present medications.                           - Await pathology results.                           - Repeat colonoscopy date to be determined after  pending pathology results are reviewed for                            surveillance.                           - Emerging evidence supports eating a diet of                            fruits, vegetables, grains, calcium, and yogurt                            while reducing red meat and alcohol may reduce the                            risk of colon cancer.                           - Thank you for allowing me to be involved in your                            colon cancer prevention. Procedure Code(s):        --- Professional ---                           [phone], Colonoscopy, flexible; with removal of                            tumor(s), polyp(s), or other lesion(s) by snare                            technique Diagnosis Code(s):        --- Professional ---                           Z12.11, Encounter for screening for malignant                            neoplasm of colon                           K63.5, Polyp of colon CPT copyright 2019 American Medical Association. All rights reserved. The codes documented in this report are preliminary and upon coder review may  be revised to meet current compliance requirements. Thornton Park MD, MD 10/05/2020 11:09:10 AM This report has  been signed electronically. Number of Addenda: 0

## 2020-10-05 NOTE — Transfer of Care (Signed)
Immediate Anesthesia Transfer of Care Note  Patient: Jill Davis  Procedure(s) Performed: COLONOSCOPY WITH PROPOFOL (N/A ) POLYPECTOMY  Patient Location: PACU and Endoscopy Unit  Anesthesia Type:MAC  Level of Consciousness: awake and drowsy  Airway & Oxygen Therapy: Patient Spontanous Breathing  Post-op Assessment: Report given to RN and Post -op Vital signs reviewed and stable  Post vital signs: Reviewed and stable  Last Vitals:  Vitals Value Taken Time  BP 129/65 10/05/20 1110  Temp    Pulse 66 10/05/20 1111  Resp 19 10/05/20 1111  SpO2 95 % 10/05/20 1111  Vitals shown include unvalidated device data.  Last Pain:  Vitals:   10/05/20 1027  TempSrc: Oral  PainSc: 0-No pain         Complications: No complications documented.

## 2020-10-05 NOTE — Discharge Instructions (Signed)

## 2020-10-05 NOTE — Anesthesia Preprocedure Evaluation (Addendum)
Anesthesia Evaluation  Patient identified by MRN, date of birth, ID band Patient awake    Reviewed: Allergy & Precautions, H&P , NPO status , Patient's Chart, lab work & pertinent test results, reviewed documented beta blocker date and time   Airway Mallampati: I  TM Distance: >3 FB Neck ROM: full    Dental no notable dental hx. (+) Teeth Intact, Dental Advisory Given   Pulmonary neg pulmonary ROS, former smoker,    Pulmonary exam normal breath sounds clear to auscultation       Cardiovascular Exercise Tolerance: Good negative cardio ROS   Rhythm:regular Rate:Normal     Neuro/Psych PSYCHIATRIC DISORDERS Anxiety Depression  Neuromuscular disease    GI/Hepatic GERD  ,(+)     substance abuse  ,   Endo/Other  Morbid obesity  Renal/GU negative Renal ROS  negative genitourinary   Musculoskeletal   Abdominal (+) + obese,   Peds  Hematology negative hematology ROS (+)   Anesthesia Other Findings   Reproductive/Obstetrics negative OB ROS                            Anesthesia Physical Anesthesia Plan  ASA: III  Anesthesia Plan: MAC   Post-op Pain Management:    Induction: Intravenous  PONV Risk Score and Plan:   Airway Management Planned: Natural Airway, Nasal Cannula, Simple Face Mask and Mask  Additional Equipment:   Intra-op Plan:   Post-operative Plan: Extubation in OR  Informed Consent: I have reviewed the patients History and Physical, chart, labs and discussed the procedure including the risks, benefits and alternatives for the proposed anesthesia with the patient or authorized representative who has indicated his/her understanding and acceptance.     Dental Advisory Given  Plan Discussed with: CRNA and Anesthesiologist  Anesthesia Plan Comments: (  )        Anesthesia Quick Evaluation

## 2020-10-05 NOTE — H&P (Signed)
Referring Provider: No ref. provider found Primary Care Physician:  Isaac Bliss, Rayford Halsted, MD  Reason for Consultation:  Colon cancer screening   IMPRESSION:  Need for colon cancer screening No known family history of colon cancer or polyps  PLAN: Colonoscopy  HPI: Jill Davis is a 54 y.o. female who presents for screening colonoscopy. GI ROS is negative.   No known family history of colon cancer or polyps. No family history of uterine/endometrial cancer, pancreatic cancer or gastric/stomach cancer.   Past Medical History:  Diagnosis Date  . Allergy    grass   . Anemia   . Anxiety   . 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 Asthma    none since moved to [place]   . Depression   . GERD (gastroesophageal reflux disease)    PRn Rolaids -  gets with red sauces etc   . Heart murmur    as baby   . History of substance abuse ([place])   . Insomnia   . Morbid obesity ([place])   . Neuromuscular disorder ([facility])    plantar fascitis   . Substance abuse ([place])   . Thyroid disease    currently no meds     Past Surgical History:  Procedure Laterality Date  . ABDOMINAL HYSTERECTOMY    . BREAST BIOPSY Left 11/21/2015   benign  . cyst on buttocks     . PARTIAL HYSTERECTOMY     prior to abd full hysterectomy    Current Facility-Administered Medications  Medication Dose Route Frequency Provider Last Rate Last Admin  . 0.9 %  sodium chloride infusion   Intravenous Continuous Thornton Park, MD      . lactated ringers infusion   Intravenous Continuous Thornton Park, MD 10 mL/hr at 10/05/20 1037 1,000 mL at 10/05/20 1037    Allergies as of 08/18/2020 - Review Complete 08/17/2020  Allergen Reaction Noted  . Celebrex [celecoxib] Itching 06/24/2020    Family History  Problem Relation Age of Onset  . Diabetes Mother   . Hypertension Mother   . CAD Father   . Hypertension Father   . Breast cancer Sister   . Hypertension Brother   . Colon cancer Neg Hx   . Colon  polyps Neg Hx   . Esophageal cancer Neg Hx   . Stomach cancer Neg Hx   . Rectal cancer Neg Hx     Social History   Socioeconomic History  . Marital status: Legally Separated    Spouse name: Not on file  . Number of children: Not on file  . Years of education: Not on file  . Highest education level: Not on file  Occupational History  . Not on file  Tobacco Use  . Smoking status: Former Research scientist (life sciences)  . Smokeless tobacco: Never Used  Substance and Sexual Activity  . Alcohol use: Not Currently  . Drug use: Yes    Types: Cocaine, Marijuana, "Crack" cocaine, Heroin    Comment: quit 1990  . Sexual activity: Not on file  Other Topics Concern  . Not on file  Social History Narrative  . Not on file   Social Determinants of Health   Financial Resource Strain:   . Difficulty of Paying Living Expenses: Not on file  Food Insecurity:   . Worried About Charity fundraiser in the Last Year: Not on file  . Ran Out of Food in the Last Year: Not on file  Transportation  Needs:   . Lack of Transportation (Medical): Not on file  . Lack of Transportation (Non-Medical): Not on file  Physical Activity:   . Days of Exercise per Week: Not on file  . Minutes of Exercise per Session: Not on file  Stress:   . Feeling of Stress : Not on file  Social Connections:   . Frequency of Communication with Friends and Family: Not on file  . Frequency of Social Gatherings with Friends and Family: Not on file  . Attends Religious Services: Not on file  . Active Member of Clubs or Organizations: Not on file  . Attends Archivist Meetings: Not on file  . Marital Status: Not on file  Intimate Partner Violence:   . Fear of Current or Ex-Partner: Not on file  . Emotionally Abused: Not on file  . Physically Abused: Not on file  . Sexually Abused: Not on file    Review of Systems: 12 system ROS is negative except as noted above.   Physical Exam: General:   Alert,  well-nourished, pleasant and  cooperative in NAD Head:  Normocephalic and atraumatic. Eyes:  Sclera clear, no icterus.   Conjunctiva pink. Ears:  Normal auditory acuity. Nose:  No deformity, discharge,  or lesions. Mouth:  No deformity or lesions.   Neck:  Supple; no masses or thyromegaly. Lungs:  Clear throughout to auscultation.   No wheezes. Heart:  Regular rate and rhythm; no murmurs. Abdomen:  Soft,nontender, nondistended, normal bowel sounds, no rebound or guarding. No hepatosplenomegaly.   Rectal:  Deferred  Msk:  Symmetrical. No boney deformities LAD: No inguinal or umbilical LAD Extremities:  No clubbing or edema. Neurologic:  Alert and  oriented x4;  grossly nonfocal Skin:  Intact without significant lesions or rashes. Psych:  Alert and cooperative. Normal mood and affect.    Akia Desroches L. Tarri Glenn, MD, MPH 10/05/2020, 10:39 AM

## 2020-10-06 ENCOUNTER — Encounter: Payer: Self-pay | Admitting: Gastroenterology

## 2020-10-06 ENCOUNTER — Encounter (HOSPITAL_COMMUNITY): Payer: Self-pay | Admitting: Gastroenterology

## 2020-10-06 LAB — SURGICAL PATHOLOGY

## 2020-10-06 NOTE — Anesthesia Postprocedure Evaluation (Signed)
Anesthesia Post Note  Patient: Librarian, academic  Procedure(s) Performed: COLONOSCOPY WITH PROPOFOL (N/A ) POLYPECTOMY     Patient location during evaluation: PACU Anesthesia Type: MAC Level of consciousness: awake and alert Pain management: pain level controlled Vital Signs Assessment: post-procedure vital signs reviewed and stable Respiratory status: spontaneous breathing, nonlabored ventilation, respiratory function stable and patient connected to nasal cannula oxygen Cardiovascular status: stable and blood pressure returned to baseline Postop Assessment: no apparent nausea or vomiting Anesthetic complications: no   No complications documented.  Last Vitals:  Vitals:   10/05/20 1111 10/05/20 1120  BP: 129/65 (!) 114/48  Pulse: 66 (!) 58  Resp: 19 (!) 21  Temp: 36.6 C   SpO2: 96% 93%    Last Pain:  Vitals:   10/05/20 1120  TempSrc:   PainSc: 0-No pain                 Jazsmin Couse

## 2020-10-08 ENCOUNTER — Encounter: Payer: Self-pay | Admitting: Internal Medicine

## 2020-10-08 ENCOUNTER — Ambulatory Visit: Payer: 59 | Admitting: Internal Medicine

## 2020-10-08 ENCOUNTER — Other Ambulatory Visit: Payer: Self-pay | Admitting: Internal Medicine

## 2020-10-08 ENCOUNTER — Other Ambulatory Visit (INDEPENDENT_AMBULATORY_CARE_PROVIDER_SITE_OTHER): Payer: 59

## 2020-10-08 ENCOUNTER — Other Ambulatory Visit: Payer: Self-pay

## 2020-10-08 ENCOUNTER — Ambulatory Visit: Payer: 59 | Admitting: Family Medicine

## 2020-10-08 VITALS — BP 130/80 | HR 69 | Temp 98.2°F | Wt 298.8 lb

## 2020-10-08 DIAGNOSIS — E559 Vitamin D deficiency, unspecified: Secondary | ICD-10-CM

## 2020-10-08 DIAGNOSIS — R296 Repeated falls: Secondary | ICD-10-CM

## 2020-10-08 DIAGNOSIS — M5417 Radiculopathy, lumbosacral region: Secondary | ICD-10-CM

## 2020-10-08 LAB — VITAMIN D 25 HYDROXY (VIT D DEFICIENCY, FRACTURES): VITD: 30.4 ng/mL (ref 30.00–100.00)

## 2020-10-08 MED ORDER — VITAMIN D (ERGOCALCIFEROL) 1.25 MG (50000 UNIT) PO CAPS: 50000.0000 [IU] | ORAL_CAPSULE | ORAL | 0 refills | Status: DC

## 2020-10-08 NOTE — Progress Notes (Signed)
Acute office Visit     This visit occurred during the SARS-CoV-2 public health emergency.  Safety protocols were in place, including screening questions prior to the visit, additional usage of staff PPE, and extensive cleaning of exam room while observing appropriate contact time as indicated for disinfecting solutions.    CC/Reason for Visit: Frequent falls, left leg numbness  HPI: Jill Davis is a 54 y.o. female who is coming in today for the above mentioned reasons. Past Medical History is significant for: Morbid obesity, vitamin D deficiency, depression.  Since the beginning of October she has had 3 falls.  She states that her left leg all of a sudden just "gives out" toppling her to the ground.  She has never hit her head or lost consciousness.  In the last few weeks she has had progressive numbness to the lateral, outer side of her left thigh.  It does not progress below the knee.  She has chronic back pain and has not noticed an increase.  She states that she notices frequent temperature differential in her thigh, hot 1 minute, cold the next.  She also sometimes feels like bugs are crawling on her outer thigh.  At one point she tried touching her outer thigh with a sharp object and did not feel it.  She is mostly concerned about the falls.   Past Medical/Surgical History: Past Medical History:  Diagnosis Date  . Allergy    grass   . Anemia   . Anxiety   . 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 Asthma    none since moved to [place]   . Depression   . GERD (gastroesophageal reflux disease)    PRn Rolaids -  gets with red sauces etc   . Heart murmur    as baby   . History of substance abuse ([place])   . Insomnia   . Morbid obesity ([place])   . Neuromuscular disorder ([facility])    plantar fascitis   . Substance abuse ([place])   . Thyroid disease    currently no meds     Past Surgical History:  Procedure Laterality Date  . ABDOMINAL HYSTERECTOMY    . BREAST BIOPSY Left  11/21/2015   benign  . COLONOSCOPY WITH PROPOFOL N/A 10/05/2020   Procedure: COLONOSCOPY WITH PROPOFOL;  Surgeon: Thornton Park, MD;  Location: [facility];  Service: Gastroenterology;  Laterality: N/A;  . cyst on buttocks     . PARTIAL HYSTERECTOMY     prior to abd full hysterectomy  . POLYPECTOMY  10/05/2020   Procedure: POLYPECTOMY;  Surgeon: Thornton Park, MD;  Location: [facility];  Service: Gastroenterology;;    Social History:  reports that she has quit smoking. She has never used smokeless tobacco. She reports previous alcohol use. She reports current drug use. Drugs: Cocaine, Marijuana, "Crack" cocaine, and Heroin.  Allergies: Allergies  Allergen Reactions  . Celebrex [Celecoxib] Itching    Family History:  Family History  Problem Relation Age of Onset  . Diabetes Mother   . Hypertension Mother   . CAD Father   . Hypertension Father   . Breast cancer Sister   . Hypertension Brother   . Colon cancer Neg Hx   . Colon polyps Neg Hx   . Esophageal cancer Neg Hx   . Stomach cancer Neg Hx   . Rectal cancer Neg Hx      Current Outpatient Medications:  .  citalopram (  CELEXA) 20 MG tablet, Take 1 tablet (20 mg total) by mouth daily., Disp: 90 tablet, Rfl: 1 .  diphenhydramine-acetaminophen (TYLENOL PM) 25-500 MG TABS tablet, Take 2 tablets by mouth at bedtime as needed (sleep.)., Disp: , Rfl:  .  ibuprofen (ADVIL) 200 MG tablet, Take 1,000 mg by mouth every 8 (eight) hours as needed (for pain.). , Disp: , Rfl:  .  Multiple Vitamin (MULTIVITAMIN WITH MINERALS) TABS tablet, Take 1 tablet by mouth daily., Disp: , Rfl:  .  Sodium Sulfate-Mag Sulfate-KCl (SUTAB) [phone] MG TABS, Take 24 tablets by mouth as directed. MANUFACTURER CODES!! BIN: [id number] PCN: CN GROUP: [id number] MEMBER ID: [id number];VEL AS CASH;NO PRIOR AUTHORIZATION, Disp: 24 tablet, Rfl: 0 .  Vitamin D, Ergocalciferol, (DRISDOL) 1.25 MG (50000 UNIT) CAPS capsule, Take 1 capsule (50,000 Units total)  by mouth every 7 (seven) days for 12 doses., Disp: 12 capsule, Rfl: 0  Review of Systems:  Constitutional: Denies fever, chills, diaphoresis, appetite change and fatigue.  HEENT: Denies photophobia, eye pain, redness, hearing loss, ear pain, congestion, sore throat, rhinorrhea, sneezing, mouth sores, trouble swallowing, neck pain, neck stiffness and tinnitus.   Respiratory: Denies SOB, DOE, cough, chest tightness,  and wheezing.   Cardiovascular: Denies chest pain, palpitations and leg swelling.  Gastrointestinal: Denies nausea, vomiting, abdominal pain, diarrhea, constipation, blood in stool and abdominal distention.  Genitourinary: Denies dysuria, urgency, frequency, hematuria, flank pain and difficulty urinating.  Endocrine: Denies: hot or cold intolerance, sweats, changes in hair or nails, polyuria, polydipsia. Musculoskeletal: Denies myalgias,  joint swelling, arthralgias and gait problem.  Skin: Denies pallor, rash and wound.  Neurological: Denies dizziness, seizures, syncope, weakness, light-headedness and headaches.  Hematological: Denies adenopathy. Easy bruising, personal or family bleeding history  Psychiatric/Behavioral: Denies suicidal ideation, mood changes, confusion, nervousness, sleep disturbance and agitation    Physical Exam: Vitals:   10/08/20 1444  BP: 130/80  Pulse: 69  Temp: 98.2 F (36.8 C)  TempSrc: Oral  SpO2: 99%  Weight: 298 lb 12.8 oz (135.5 kg)    Body mass index is 51.29 kg/m.   Constitutional: NAD, calm, comfortable, morbid obesity Eyes: PERRL, lids and conjunctivae normal ENMT: Mucous membranes are moist.  Musculoskeletal: no clubbing / cyanosis. No joint deformity upper and lower extremities. Good ROM, no contractures. Normal muscle tone.  Psychiatric: Normal judgment and insight. Alert and oriented x 3. Normal mood.    Impression and Plan:  Frequent falls  Lumbosacral radiculopathy at L5  -With her chronic back pain and outer thigh pain,  I am concerned that this might be L5 radiculopathy with nerve compression. -Given her frequent falls, I believe an MRI of the L-spine is prudent. -She has recently had thyroid levels checked, vitamin B12.  Vitamin D deficiency -Another prescription for another 3 months of vitamin D was just sent in.  Morbid obesity ([place]) -Discussed healthy lifestyle, including increased physical activity and better food choices to promote weight loss. -She is filling out paperwork for her bariatric surgical referral.    Patient Instructions  -Nice seeing you today!!  -MRI will be scheduled for you. Will follow up once results are available.     [name], MD [facility]

## 2020-10-08 NOTE — Addendum Note (Signed)
Addended by: Marrion Coy on: 10/08/2020 07:44 AM   Modules accepted: Orders

## 2020-10-08 NOTE — Patient Instructions (Signed)
-  Nice seeing you today!!  -MRI will be scheduled for you. Will follow up once results are available.

## 2020-10-20 ENCOUNTER — Encounter: Payer: Self-pay | Admitting: Internal Medicine

## 2020-11-02 ENCOUNTER — Ambulatory Visit: Payer: 59 | Admitting: Internal Medicine

## 2020-12-07 ENCOUNTER — Other Ambulatory Visit: Payer: Self-pay | Admitting: Internal Medicine

## 2020-12-07 DIAGNOSIS — F331 Major depressive disorder, recurrent, moderate: Secondary | ICD-10-CM

## 2020-12-23 ENCOUNTER — Other Ambulatory Visit: Payer: Self-pay | Admitting: Internal Medicine

## 2020-12-23 DIAGNOSIS — E559 Vitamin D deficiency, unspecified: Secondary | ICD-10-CM

## 2021-01-20 ENCOUNTER — Encounter: Payer: Self-pay | Admitting: Internal Medicine

## 2021-01-20 NOTE — Telephone Encounter (Signed)
I dont see Ambien on her current medication or historical list.  Okay to send?

## 2021-02-02 ENCOUNTER — Other Ambulatory Visit: Payer: Self-pay

## 2021-02-03 ENCOUNTER — Ambulatory Visit: Payer: 59 | Admitting: Internal Medicine

## 2021-02-03 ENCOUNTER — Encounter: Payer: Self-pay | Admitting: Internal Medicine

## 2021-02-03 DIAGNOSIS — F331 Major depressive disorder, recurrent, moderate: Secondary | ICD-10-CM

## 2021-02-03 DIAGNOSIS — G4700 Insomnia, unspecified: Secondary | ICD-10-CM

## 2021-02-03 MED ORDER — ZOLPIDEM TARTRATE 10 MG PO TABS: 10.0000 mg | ORAL_TABLET | Freq: Every evening | ORAL | 1 refills | Status: DC | PRN

## 2021-02-03 NOTE — Progress Notes (Signed)
Established Patient Office Visit     This visit occurred during the SARS-CoV-2 public health emergency.  Safety protocols were in place, including screening questions prior to the visit, additional usage of staff PPE, and extensive cleaning of exam room while observing appropriate contact time as indicated for disinfecting solutions.    CC/Reason for Visit: Discuss sleeping issues  HPI: Jill Davis is a 55 y.o. female who is coming in today for the above mentioned reasons. Past Medical History is significant for: Depression with stable mood on Celexa and morbid obesity.  She has been working really hard on weight loss.  She has managed to lose close to 15 pounds.  She is doing this through a low-carb lifestyle, she is trying to walk every other day.  She has been having severe issues with sleeping.  She has trouble both falling asleep and staying asleep.  Her husband tells her that she snores.  She does not drink caffeinated beverages at all.  She tries not to drink fluids a few hours before her bedtime.  She has tried melatonin and over-the-counter p.m. sleeping aids as well as several herbal teas.  She tells me they work for a while and then quit working.  She has been on zolpidem in the past.   Past Medical/Surgical History: Past Medical History:  Diagnosis Date  . Allergy    grass   . Anemia   . Anxiety   . 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 Asthma    none since moved to [place]   . Depression   . GERD (gastroesophageal reflux disease)    PRn Rolaids -  gets with red sauces etc   . Heart murmur    as baby   . History of substance abuse ([place])   . Insomnia   . Morbid obesity ([place])   . Neuromuscular disorder (H[facility])    plantar fascitis   . Substance abuse ([place])   . Thyroid disease    currently no meds     Past Surgical History:  Procedure Laterality Date  . ABDOMINAL HYSTERECTOMY    . BREAST BIOPSY Left 11/21/2015   benign  . COLONOSCOPY WITH PROPOFOL N/A  10/05/2020   Procedure: COLONOSCOPY WITH PROPOFOL;  Surgeon: Thornton Park, MD;  Location: [facility];  Service: Gastroenterology;  Laterality: N/A;  . cyst on buttocks     . PARTIAL HYSTERECTOMY     prior to abd full hysterectomy  . POLYPECTOMY  10/05/2020   Procedure: POLYPECTOMY;  Surgeon: Thornton Park, MD;  [facility]: [facility];  Service: Gastroenterology;;    Social History:  reports that she has quit smoking. She has never used smokeless tobacco. She reports previous alcohol use. She reports current drug use. Drugs: Cocaine, Marijuana, "Crack" cocaine, and Heroin.  Allergies: Allergies  Allergen Reactions  . Celebrex [Celecoxib] Itching    Family History:  Family History  Problem Relation Age of Onset  . Diabetes Mother   . Hypertension Mother   . CAD Father   . Hypertension Father   . Breast cancer Sister   . Hypertension Brother   . Colon cancer Neg Hx   . Colon polyps Neg Hx   . Esophageal cancer Neg Hx   . Stomach cancer Neg Hx   . Rectal cancer Neg Hx      Current Outpatient Medications:  .  citalopram (CELEXA) 20 MG tablet, TAKE 1 TABLET BY MOUTH EVERY DAY, Disp:  90 tablet, Rfl: 1 .  diphenhydramine-acetaminophen (TYLENOL PM) 25-500 MG TABS tablet, Take 2 tablets by mouth at bedtime as needed (sleep.)., Disp: , Rfl:  .  ibuprofen (ADVIL) 200 MG tablet, Take 1,000 mg by mouth every 8 (eight) hours as needed (for pain.). , Disp: , Rfl:  .  Vitamin D, Ergocalciferol, (DRISDOL) 1.25 MG (50000 UNIT) CAPS capsule, TAKE 1 CAPSULE (50,000 UNITS TOTAL) BY MOUTH EVERY 7 (SEVEN) DAYS FOR 12 DOSES., Disp: 12 capsule, Rfl: 0 .  zolpidem (AMBIEN) 10 MG tablet, Take 1 tablet (10 mg total) by mouth at bedtime as needed for sleep., Disp: 15 tablet, Rfl: 1  Review of Systems:  Constitutional: Denies fever, chills, diaphoresis, appetite change and fatigue.  HEENT: Denies photophobia, eye pain, redness, hearing loss, ear pain, congestion, sore throat, rhinorrhea,  sneezing, mouth sores, trouble swallowing, neck pain, neck stiffness and tinnitus.   Respiratory: Denies SOB, DOE, cough, chest tightness,  and wheezing.   Cardiovascular: Denies chest pain, palpitations and leg swelling.  Gastrointestinal: Denies nausea, vomiting, abdominal pain, diarrhea, constipation, blood in stool and abdominal distention.  Genitourinary: Denies dysuria, urgency, frequency, hematuria, flank pain and difficulty urinating.  Endocrine: Denies: hot or cold intolerance, sweats, changes in hair or nails, polyuria, polydipsia. Musculoskeletal: Denies myalgias, back pain, joint swelling, arthralgias and gait problem.  Skin: Denies pallor, rash and wound.  Neurological: Denies dizziness, seizures, syncope, weakness, light-headedness, numbness and headaches.  Hematological: Denies adenopathy. Easy bruising, personal or family bleeding history  Psychiatric/Behavioral: Denies suicidal ideation, mood changes, confusion, nervousness, sleep disturbance and agitation    Physical Exam: Vitals:   02/03/21 1532  BP: 124/80  Pulse: 84  Temp: 98 F (36.7 C)  TempSrc: Oral  SpO2: 97%  Weight: 292 lb 14.4 oz (132.9 kg)    Body mass index is 50.28 kg/m.   Constitutional: NAD, calm, comfortable Eyes: PERRL, lids and conjunctivae normal ENMT: Mucous membranes are moist.  Respiratory: clear to auscultation bilaterally, no wheezing, no crackles. Normal respiratory effort. No accessory muscle use.  Cardiovascular: Regular rate and rhythm, no murmurs / rubs / gallops. No extremity edema. Neurologic: Grossly intact and nonfocal Psychiatric: Normal judgment and insight. Alert and oriented x 3. Normal mood.    Impression and Plan:  Morbid obesity ([place]) -Discussed healthy lifestyle, including increased physical activity and better food choices to promote weight loss. -She has been congratulated on her weight loss success thus far. -Unfortunately her insurance did not cover bariatric  surgery.  Moderate episode of recurrent major depressive disorder [facility]) [place] Visit from 02/03/2021 in [place] [facility] [place]  PHQ-9 Total Score 3     -Mood is stable on Celexa  Insomnia, unspecified type  -I suspect a lot of her issues are likely related to obstructive sleep apnea/obesity hypoventilation syndrome. -She will be referred to sleep medicine for sleep study. -I will give her 15 tablets of 10 mg of zolpidem for her to use sparingly.     [name], MD [facility] at [facility]

## 2021-02-14 ENCOUNTER — Encounter: Payer: Self-pay | Admitting: Internal Medicine

## 2021-02-14 DIAGNOSIS — G4700 Insomnia, unspecified: Secondary | ICD-10-CM

## 2021-03-11 ENCOUNTER — Other Ambulatory Visit: Payer: Self-pay | Admitting: Internal Medicine

## 2021-03-11 DIAGNOSIS — E559 Vitamin D deficiency, unspecified: Secondary | ICD-10-CM

## 2021-03-16 ENCOUNTER — Institutional Professional Consult (permissible substitution): Payer: 59 | Admitting: Neurology

## 2021-04-05 ENCOUNTER — Encounter: Payer: Self-pay | Admitting: Internal Medicine

## 2021-04-05 DIAGNOSIS — G4700 Insomnia, unspecified: Secondary | ICD-10-CM

## 2021-04-05 MED ORDER — ZOLPIDEM TARTRATE 10 MG PO TABS: 10.0000 mg | ORAL_TABLET | Freq: Every evening | ORAL | 1 refills | Status: DC | PRN

## 2021-04-14 ENCOUNTER — Encounter: Payer: Self-pay | Admitting: Neurology

## 2021-04-14 ENCOUNTER — Telehealth: Payer: Self-pay | Admitting: Neurology

## 2021-04-14 NOTE — Telephone Encounter (Signed)
Called the patient because Dr Rexene Alberts has had something that has come up on June 2, 22 at 12:30 pm.  She is asked that I move the patient's appointment.  Attempted to call the patient to review this and the phone connection was bad.  I will try back again.  **If patient calls back please ask if she would be willing to be seen earlier. I have 04/19/21 at 9:30 am available.

## 2021-04-19 ENCOUNTER — Ambulatory Visit: Payer: 59 | Admitting: Neurology

## 2021-04-19 ENCOUNTER — Encounter: Payer: Self-pay | Admitting: Neurology

## 2021-04-19 VITALS — BP 114/64 | HR 66 | Ht 64.5 in | Wt 301.0 lb

## 2021-04-19 DIAGNOSIS — G4719 Other hypersomnia: Secondary | ICD-10-CM

## 2021-04-19 DIAGNOSIS — R0683 Snoring: Secondary | ICD-10-CM | POA: Diagnosis not present

## 2021-04-19 DIAGNOSIS — Z6841 Body Mass Index (BMI) 40.0 and over, adult: Secondary | ICD-10-CM

## 2021-04-19 DIAGNOSIS — R351 Nocturia: Secondary | ICD-10-CM

## 2021-04-19 DIAGNOSIS — G4700 Insomnia, unspecified: Secondary | ICD-10-CM | POA: Diagnosis not present

## 2021-04-19 DIAGNOSIS — R635 Abnormal weight gain: Secondary | ICD-10-CM

## 2021-04-19 DIAGNOSIS — R519 Headache, unspecified: Secondary | ICD-10-CM

## 2021-04-19 NOTE — Patient Instructions (Signed)

## 2021-04-19 NOTE — Progress Notes (Signed)
Subjective:    Patient ID: Jill Davis is a 55 y.o. female.  HPI     Jill Age, MD, PhD [facility] [address], [address] [place], [place] [address]  Dear Dr. Isaac Bliss,   I saw your patient, Jill Davis, upon your kind request, in my Sleep clinic today for initial consultation of her sleep disorder, in particular, concern for underlying obstructive sleep apnea.  The patient is accompanied by her husband today.  As you know, Jill Davis is a 55 year old right-handed woman with an underlying medical history of allergies, arthritis, asthma, anemia, anxiety, depression, reflux disease, vitamin D deficiency, and morbid obesity with a BMI of over 50, who reports difficulty initiating and maintaining sleep as well as snoring and daytime somnolence.  I reviewed your office note from 02/03/2021.  Her Epworth sleepiness score is 9 out of 24, fatigue severity score is 61 out of 63.  Her husband reports that her snoring can be loud.  She has a family history of sleep apnea affecting her mom and her sister, both have CPAP machines.  The patient has never had a sleep study.  She has tried Ambien in the past and was on it for some years, then was off for several years and recently restarted it.  She has a bedtime of around 10 PM and rise time of around 7 or 7:30 AM.  She drinks caffeine in the form of soda, about 3 servings per average day, no alcohol and no drugs for over 30 years.  She quit smoking about a year and a half ago.  She lives with her husband, they have 2 cats in the household, she does sleep with the TV on at night in her bedroom.  She has a history of bruxism and has tried an over-the-counter bite guard.  She has tried over-the-counter medication for sleep including ZzzQuil and melatonin.  She does nap during the day when she can, not daily.  She works mostly from home.  She has gained maybe 25 to 30 pounds in the past 2 years.  She had a tonsillectomy as  a child.  She has had occasional morning headaches and has nocturia about once per average night.   Her Past Medical History Is Significant For: Past Medical History:  Diagnosis Date  . Allergy    grass   . Anemia   . Anxiety   . 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 Asthma    none since moved to [place]   . Depression   . GERD (gastroesophageal reflux disease)    PRn Rolaids -  gets with red sauces etc   . Heart murmur    as baby   . History of substance abuse ([place])   . Insomnia   . Morbid obesity ([place])   . Neuromuscular disorder ([facility])    plantar fascitis   . Substance abuse ([place])   . Thyroid disease    currently no meds     Her Past Surgical History Is Significant For: Past Surgical History:  Procedure Laterality Date  . ABDOMINAL HYSTERECTOMY    . BREAST BIOPSY Left 11/21/2015   benign  . COLONOSCOPY WITH PROPOFOL N/A 10/05/2020   Procedure: COLONOSCOPY WITH PROPOFOL;  Surgeon: Thornton Park, MD;  Location: [facility];  Service: Gastroenterology;  Laterality: N/A;  . cyst on buttocks     . PARTIAL HYSTERECTOMY     prior to abd  full hysterectomy  . POLYPECTOMY  10/05/2020   Procedure: POLYPECTOMY;  Surgeon: Thornton Park, MD;  Location: [facility];  Service: Gastroenterology;;    Her Family History Is Significant For: Family History  Problem Relation Davis of Onset  . Diabetes Mother   . Hypertension Mother   . CAD Father   . Hypertension Father   . Breast cancer Sister   . Hypertension Brother   . Colon cancer Neg Hx   . Colon polyps Neg Hx   . Esophageal cancer Neg Hx   . Stomach cancer Neg Hx   . Rectal cancer Neg Hx     Her Social History Is Significant For: Social History   Socioeconomic History  . Marital status: Legally Separated    Spouse name: Not on file  . Number of children: Not on file  . Years of education: Not on file  . Highest education level: Not on file  Occupational History  . Not on file  Tobacco Use  .  Smoking status: Former Research scientist (life sciences)  . Smokeless tobacco: Never Used  Substance and Sexual Activity  . Alcohol use: Not Currently  . Drug use: Yes    Types: Cocaine, Marijuana, "Crack" cocaine, Heroin    Comment: quit 1990  . Sexual activity: Not on file  Other Topics Concern  . Not on file  Social History Narrative  . Not on file   Social Determinants of Health   Financial Resource Strain: Not on file  Food Insecurity: Not on file  Transportation Needs: Not on file  Physical Activity: Not on file  Stress: Not on file  Social Connections: Not on file    Her Allergies Are:  Allergies  Allergen Reactions  . Celebrex [Celecoxib] Itching  :   Her Current Medications Are:  Outpatient Encounter Medications as of 04/19/2021  Medication Sig  . citalopram (CELEXA) 20 MG tablet TAKE 1 TABLET BY MOUTH EVERY DAY  . diphenhydramine-acetaminophen (TYLENOL PM) 25-500 MG TABS tablet Take 2 tablets by mouth at bedtime as needed (sleep.).  Marland Kitchen ibuprofen (ADVIL) 200 MG tablet Take 1,000 mg by mouth every 8 (eight) hours as needed (for pain.).   Marland Kitchen zolpidem (AMBIEN) 10 MG tablet Take 1 tablet (10 mg total) by mouth at bedtime as needed for sleep.   No facility-administered encounter medications on file as of 04/19/2021.  :   Review of Systems:  Out of a complete 14 point review of systems, all are reviewed and negative with the exception of these symptoms as listed below: Review of Systems  Neurological:       Here for sleep consult. No prior sleep study. Pt reports she has trouble going to sleep and staying asleep.  Has tried [place] in the past which did help. Has tried OTC sleep aids no benefit noted with them either.  Epworth Sleepiness Scale 0= would never doze 1= slight chance of dozing 2= moderate chance of dozing 3= high chance of dozing  Sitting and reading:3 Watching TV:1 Sitting inactive in a public place (ex. Theater or meeting):0 As a passenger in a car for an hour without a  break:0 Lying down to rest in the afternoon:3 Sitting and talking to someone:0 Sitting quietly after lunch (no alcohol):2 In a car, while stopped in traffic:0 Total:9     Objective:  Neurological Exam  Physical Exam Physical Examination:   Vitals:   04/19/21 0945  BP: 114/64  Pulse: 66  SpO2: 97%   General Examination: The patient is a very  pleasant 55 y.o. female in no acute distress. She appears well-developed and well-nourished and well groomed.   HEENT: Normocephalic, atraumatic, pupils are equal, round and reactive to light, extraocular tracking is good without limitation to gaze excursion or nystagmus noted. Hearing is grossly intact. Face is symmetric with normal facial animation. Speech is clear with no dysarthria noted. There is no hypophonia. There is no lip, neck/head, jaw or voice tremor. Neck is supple with full range of passive and active motion. There are no carotid bruits on auscultation. Oropharynx exam reveals: mild mouth dryness, adequate dental hygiene and moderate airway crowding, due to small airway entry.  Mallampati class II, tonsils absent, neck circumference of 19 inches.  She has a minimal overbite.  Tongue protrudes centrally and palate elevates symmetrically.  Chest: Clear to auscultation without wheezing, rhonchi or crackles noted.  Heart: S1+S2+0, regular and normal without murmurs, rubs or gallops noted.   Abdomen: Soft, non-tender and non-distended with normal bowel sounds appreciated on auscultation.  Extremities: There is no pitting edema in the distal lower extremities bilaterally.   Skin: Warm and dry without trophic changes noted.   Musculoskeletal: exam reveals no obvious joint deformities, tenderness or joint swelling or erythema.   Neurologically:  Mental status: The patient is awake, alert and oriented in all 4 spheres. Her immediate and remote memory, attention, language skills and fund of knowledge are appropriate. There is no evidence  of aphasia, agnosia, apraxia or anomia. Speech is clear with normal prosody and enunciation. Thought process is linear. Mood is normal and affect is normal.  Cranial nerves II - XII are as described above under HEENT exam.  Motor exam: Normal bulk, strength and tone is noted. There is no tremor, Romberg is negative. Fine motor skills and coordination: grossly intact.  Cerebellar testing: No dysmetria or intention tremor. There is no truncal or gait ataxia.  Sensory exam: intact to light touch in the upper and lower extremities.  Gait, station and balance: She stands easily. No veering to one side is noted. No leaning to one side is noted. Posture is Davis-appropriate and stance is narrow based. Gait shows normal  stride length and normal pace. No problems turning are noted.   Assessment and Plan:   In summary, Heiley Jill Davis is a very pleasant 55 y.o.-year old female with an underlying medical history of allergies, arthritis, asthma, anemia, anxiety, depression, reflux disease, vitamin D deficiency, and morbid obesity with a BMI of over 50, whose history and physical exam are concerning for obstructive sleep apnea (OSA). I had a long chat with the patient and her husband about my findings and the diagnosis of OSA, its prognosis and treatment options. We talked about medical treatments, surgical interventions and non-pharmacological approaches. I explained in particular the risks and ramifications of untreated moderate to severe OSA, especially with respect to developing cardiovascular disease down the Road, including congestive heart failure, difficult to treat hypertension, cardiac arrhythmias, or stroke. Even type 2 diabetes has, in part, been linked to untreated OSA. Symptoms of untreated OSA include daytime sleepiness, memory problems, mood irritability and mood disorder such as depression and anxiety, lack of energy, as well as recurrent headaches, especially morning headaches. We talked about trying to  maintain a healthy lifestyle in general, as well as the importance of weight control. We also talked about the importance of good sleep hygiene. I recommended the following at this time: sleep study.  I explained the sleep test procedure to the patient and also outlined the  difference between a laboratory attended sleep study versus a home sleep test.  We also talked about treatment options for sleep apnea including a custom-made dental device (which would require a referral to a specialist dentist or oral surgeon), upper airway surgical options, such as traditional UPPP or a novel less invasive surgical option in the form of Inspire hypoglossal nerve stimulation (which would involve a referral to an ENT surgeon). I also explained the PAP (positive airway pressure) treatment option to the patient, who indicated that she would be willing to try PAP if the need arises.  I answered all their questions today and the patient and her husband were in agreement. I plan to see her back after the sleep study is completed and encouraged her to call with any interim questions, concerns, problems or updates.   Thank you very much for allowing me to participate in the care of this nice patient. If I can be of any further assistance to you please do not hesitate to call me at [phone].  Sincerely,   Jill Age, MD, PhD

## 2021-04-21 ENCOUNTER — Institutional Professional Consult (permissible substitution): Payer: 59 | Admitting: Neurology

## 2021-05-06 ENCOUNTER — Institutional Professional Consult (permissible substitution): Payer: 59 | Admitting: Pulmonary Disease

## 2021-05-11 ENCOUNTER — Other Ambulatory Visit: Payer: Self-pay

## 2021-05-12 ENCOUNTER — Other Ambulatory Visit: Payer: Self-pay | Admitting: Internal Medicine

## 2021-05-12 ENCOUNTER — Ambulatory Visit: Payer: 59 | Admitting: Internal Medicine

## 2021-05-12 ENCOUNTER — Encounter: Payer: Self-pay | Admitting: Internal Medicine

## 2021-05-12 VITALS — BP 122/80 | Temp 98.0°F | Wt 302.5 lb

## 2021-05-12 DIAGNOSIS — E559 Vitamin D deficiency, unspecified: Secondary | ICD-10-CM | POA: Diagnosis not present

## 2021-05-12 DIAGNOSIS — G4700 Insomnia, unspecified: Secondary | ICD-10-CM

## 2021-05-12 DIAGNOSIS — H1032 Unspecified acute conjunctivitis, left eye: Secondary | ICD-10-CM | POA: Diagnosis not present

## 2021-05-12 DIAGNOSIS — L309 Dermatitis, unspecified: Secondary | ICD-10-CM | POA: Diagnosis not present

## 2021-05-12 LAB — VITAMIN D 25 HYDROXY (VIT D DEFICIENCY, FRACTURES): VITD: 26.84 ng/mL — ABNORMAL LOW (ref 30.00–100.00)

## 2021-05-12 MED ORDER — VITAMIN D (ERGOCALCIFEROL) 1.25 MG (50000 UNIT) PO CAPS: 50000.0000 [IU] | ORAL_CAPSULE | ORAL | 0 refills | Status: DC

## 2021-05-12 MED ORDER — OFLOXACIN 0.3 % OP SOLN: 1.0000 [drp] | Freq: Four times a day (QID) | OPHTHALMIC | 0 refills | Status: DC

## 2021-05-12 MED ORDER — TRIAMCINOLONE ACETONIDE 0.1 % EX CREA: 1.0000 "application " | TOPICAL_CREAM | Freq: Two times a day (BID) | CUTANEOUS | 0 refills | Status: DC

## 2021-05-12 MED ORDER — ZOLPIDEM TARTRATE 10 MG PO TABS: 10.0000 mg | ORAL_TABLET | Freq: Every evening | ORAL | 1 refills | Status: DC | PRN

## 2021-05-12 NOTE — Progress Notes (Signed)
Established Patient Office Visit     This visit occurred during the SARS-CoV-2 public health emergency.  Safety protocols were in place, including screening questions prior to the visit, additional usage of staff PPE, and extensive cleaning of exam room while observing appropriate contact time as indicated for disinfecting solutions.    CC/Reason for Visit: Discuss several acute concerns  HPI: Jill Davis is a 55 y.o. female who is coming in today for the above mentioned reasons.  She is here today to discuss some acute issues:  1.  She is due to have her vitamin D level rechecked.  2.  She continues to have issues with insomnia and is requesting an Ambien refill today.  She has seen sleep medicine/neurology and is scheduled for a sleep study soon.  3.  She has been having some hyperpigmented, pruritic areas over her left knee and right elbow crease.  She has been applying Benadryl lotion with some relief.  4.  She has a history of recurrent bacterial conjunctivitis of the left eye.  She is a contact lens wear.  She tells me she had a recent eye exam.  She has no vision changes.  She does have some conjunctival injection.  She usually applies ofloxacin eyedrops, however has run out.  She has been doing warm compresses.  She has noticed a mild watery discharge.  At times will have some mild eyelid swelling.  None is evident today.  Past Medical/Surgical History: Past Medical History:  Diagnosis Date   Allergy    grass    Anemia    Anxiety    Arthritis    knees x 2   and hip on left    Asthma    none since moved to [place]    Depression    GERD (gastroesophageal reflux disease)    PRn Rolaids -  gets with red sauces etc    Heart murmur    as baby    History of substance abuse ([place])    Insomnia    Morbid obesity ([place])    Neuromuscular disorder ([place])    plantar fascitis    Substance abuse ([place])    Thyroid disease    currently no meds     Past Surgical History:   Procedure Laterality Date   ABDOMINAL HYSTERECTOMY     BREAST BIOPSY Left 11/21/2015   benign   COLONOSCOPY WITH PROPOFOL N/A 10/05/2020   Procedure: COLONOSCOPY WITH PROPOFOL;  Surgeon: Thornton Park, MD;  [facility]: [facility];  Service: Gastroenterology;  Laterality: N/A;   cyst on buttocks      PARTIAL HYSTERECTOMY     prior to abd full hysterectomy   POLYPECTOMY  10/05/2020   Procedure: POLYPECTOMY;  Surgeon: Thornton Park, MD;  [facility]: [facility];  Service: Gastroenterology;;    Social History:  reports that she has quit smoking. She has never used smokeless tobacco. She reports previous alcohol use. She reports current drug use. Drugs: Cocaine, Marijuana, "Crack" cocaine, and Heroin.  Allergies: Allergies  Allergen Reactions   Celebrex [Celecoxib] Itching    Family History:  Family History  Problem Relation Age of Onset   Diabetes Mother    Hypertension Mother    CAD Father    Hypertension Father    Breast cancer Sister    Hypertension Brother    Colon cancer Neg Hx    Colon polyps Neg Hx    Esophageal cancer Neg Hx    Stomach cancer Neg Hx  Rectal cancer Neg Hx      Current Outpatient Medications:    citalopram (CELEXA) 20 MG tablet, TAKE 1 TABLET BY MOUTH EVERY DAY, Disp: 90 tablet, Rfl: 1   diphenhydramine-acetaminophen (TYLENOL PM) 25-500 MG TABS tablet, Take 2 tablets by mouth at bedtime as needed (sleep.)., Disp: , Rfl:    ibuprofen (ADVIL) 200 MG tablet, Take 1,000 mg by mouth every 8 (eight) hours as needed (for pain.). , Disp: , Rfl:    ofloxacin (OCUFLOX) 0.3 % ophthalmic solution, Place 1 drop into the left eye 4 (four) times daily., Disp: 5 mL, Rfl: 0   triamcinolone cream (KENALOG) 0.1 %, Apply 1 application topically 2 (two) times daily., Disp: 30 g, Rfl: 0   zolpidem (AMBIEN) 10 MG tablet, Take 1 tablet (10 mg total) by mouth at bedtime as needed for sleep., Disp: 15 tablet, Rfl: 1  Review of Systems:  Constitutional: Denies  fever, chills, diaphoresis, appetite change. HEENT: Denies photophobia, hearing loss, ear pain, congestion, sore throat, rhinorrhea, sneezing, mouth sores, trouble swallowing, neck pain, neck stiffness and tinnitus.   Respiratory: Denies SOB, DOE, cough, chest tightness,  and wheezing.   Cardiovascular: Denies chest pain, palpitations and leg swelling.  Gastrointestinal: Denies nausea, vomiting, abdominal pain, diarrhea, constipation, blood in stool and abdominal distention.  Genitourinary: Denies dysuria, urgency, frequency, hematuria, flank pain and difficulty urinating.  Endocrine: Denies: hot or cold intolerance, sweats, changes in hair or nails, polyuria, polydipsia. Musculoskeletal: Denies myalgias, back pain, joint swelling, arthralgias and gait problem.  Skin: Denies pallor, rash and wound.  Neurological: Denies dizziness, seizures, syncope, weakness, light-headedness, numbness and headaches.  Hematological: Denies adenopathy. Easy bruising, personal or family bleeding history  Psychiatric/Behavioral: Denies suicidal ideation, mood changes, confusion, nervousness and agitation    Physical Exam: Vitals:   05/12/21 0740  BP: 122/80  Temp: 98 F (36.7 C)  TempSrc: Oral  Weight: (!) 302 lb 8 oz (137.2 kg)    Body mass index is 51.12 kg/m.   Constitutional: NAD, calm, comfortable, obese Eyes: PERRL, lids and conjunctivae normal, wears colored contact lenses that are prescription.  She does have some mild conjunctival and scleral injection on the left eye, no eyelid swelling is evident. ENMT: Mucous membranes are moist.  Skin: Eczema noticed over the right elbow crease and left knee extensor surface Neurologic: CN 2-12 grossly intact. Sensation intact, DTR normal. Strength 5/5 in all 4.  Psychiatric: Normal judgment and insight. Alert and oriented x 3. Normal mood.    Impression and Plan:  Insomnia, unspecified type  - Plan: zolpidem (AMBIEN) 10 MG tablet -In-home sleep  study soon.  May be able to wean off Ambien once fitted with CPAP.  Acute conjunctivitis of left eye, unspecified acute conjunctivitis type  - Plan: ofloxacin (OCUFLOX) 0.3 % ophthalmic solution at patient request, not clear that this is bacterial conjunctivitis. -She knows to follow-up with her ophthalmologist for further issues.  Eczema, unspecified type  - Plan: triamcinolone cream (KENALOG) 0.1 %  Vitamin D deficiency  - Plan: VITAMIN D 25 Hydroxy (Vit-D Deficiency, Fractures)  Time spent: 31 minutes reviewing chart, interviewing and examining patient and formulating plan of care      Faige Seely Isaac Bliss, MD [facility]

## 2021-05-16 ENCOUNTER — Ambulatory Visit (INDEPENDENT_AMBULATORY_CARE_PROVIDER_SITE_OTHER): Payer: 59 | Admitting: Neurology

## 2021-05-16 DIAGNOSIS — G4733 Obstructive sleep apnea (adult) (pediatric): Secondary | ICD-10-CM

## 2021-05-16 DIAGNOSIS — R519 Headache, unspecified: Secondary | ICD-10-CM

## 2021-05-16 DIAGNOSIS — R351 Nocturia: Secondary | ICD-10-CM

## 2021-05-16 DIAGNOSIS — G4700 Insomnia, unspecified: Secondary | ICD-10-CM

## 2021-05-16 DIAGNOSIS — Z6841 Body Mass Index (BMI) 40.0 and over, adult: Secondary | ICD-10-CM

## 2021-05-16 DIAGNOSIS — R635 Abnormal weight gain: Secondary | ICD-10-CM

## 2021-05-16 DIAGNOSIS — G4719 Other hypersomnia: Secondary | ICD-10-CM

## 2021-05-16 DIAGNOSIS — R0683 Snoring: Secondary | ICD-10-CM

## 2021-05-17 NOTE — Progress Notes (Signed)
See procedure note.

## 2021-05-19 NOTE — Procedures (Signed)
   [facility] ([facility]) REPORT  STUDY DATE: 05/16/21  DOB: 1966/06/02  MRN: [id number]  ORDERING CLINICIAN: Star Age, MD, PhD   REFERRING CLINICIAN: Isaac Bliss, Rayford Halsted, MD   CLINICAL INFORMATION/HISTORY: 55 year old woman with a history of allergies, arthritis, asthma, anemia, anxiety, depression, reflux disease, vitamin D deficiency, and morbid obesity with a BMI of over 50, who reports difficulty initiating and maintaining sleep as well as snoring and daytime somnolence.  Epworth sleepiness score: 9/24.  BMI: 50.3 kg/m  Neck Circumference: 19"  FINDINGS:   Sleep Summary:   Total Recording Time (hours, min): 10 h 1 min  Total Sleep Time (hours, min):  7 h 26 min   Percent REM (%):    18.71%   Respiratory Indices:   Calculated pAHI (per hour):  29.2/hour         REM pAHI:       52.6/hour       NREM pAHI:   23.8/hour  Supine AHI:    35.5/hour  Oxygen Saturation Statistics:    Oxygen Saturation (%) Mean: 93%   Minimum oxygen saturation (%):       84%   O2 Saturation Range (%):   84 - 98%    O2 Saturation (minutes) <=88%: 1.0 min  Pulse Rate Statistics:   Pulse Mean (bpm):  93 /min    Pulse Range         (39 - 95 /min)   IMPRESSION: OSA (obstructive sleep apnea)   RECOMMENDATION:  This home sleep test demonstrates moderate obstructive sleep apnea with a total AHI of 29.2/hour and O2 nadir of 84%.  Snoring was detected, ranging from mild to loud.  Treatment with positive airway pressure is recommended. The patient will be advised to proceed with an autoPAP titration/trial at home for now. A full night titration study may be considered to optimize treatment settings, if needed down the road. Please note that untreated obstructive sleep apnea may carry additional perioperative morbidity. Patients with significant obstructive sleep apnea should receive perioperative PAP therapy and the surgeons and particularly the  anesthesiologist should be informed of the diagnosis and the severity of the sleep disordered breathing. The patient should be cautioned not to drive, work at heights, or operate dangerous or heavy equipment when tired or sleepy. Review and reiteration of good sleep hygiene measures should be pursued with any patient. Other causes of the patient's symptoms, including circadian rhythm disturbances, an underlying mood disorder, medication effect and/or an underlying medical problem cannot be ruled out based on this test. Clinical correlation is recommended. The patient and her referring provider will be notified of the test results. The patient will be seen in follow up in sleep clinic at [facility].  I certify that I have reviewed the raw data recording prior to the issuance of this report in accordance with the standards of the American Academy of Sleep Medicine (AASM).  INTERPRETING PHYSICIAN:   Star Age, MD, PhD  Board Certified in Neurology and Sleep Medicine  [facility] [address], [place], [place] [address] [phone]

## 2021-05-19 NOTE — Addendum Note (Signed)
Addended by: Star Age on: 05/19/2021 04:34 PM   Modules accepted: Orders

## 2021-05-25 ENCOUNTER — Telehealth: Payer: Self-pay

## 2021-05-25 NOTE — Telephone Encounter (Signed)
-----   Message from Star Age, MD sent at 05/19/2021  4:34 PM EDT ----- Patient referred by Dr. Isaac Bliss, seen by me on 04/19/2021, patient had a HST on 05/16/2021.    Please call and notify the patient that the recent home sleep test showed obstructive sleep apnea in the moderate range. I recommend treatment in the form of autoPAP, which means, that we don't have to bring her in for a sleep study with CPAP, but will let her start using a so called autoPAP machine at home, which is a CPAP-like machine with self-adjusting pressures. We will send the order to a local DME company (of her choice, or as per insurance requirement). The DME representative will fit her with a mask, educate her on how to use the machine, how to put the mask on, etc. I have placed an order in the chart. Please send the order, talk to patient, send report to referring MD. We will need a FU in sleep clinic for 10 weeks post-PAP set up, please arrange that with me or one of our NPs. Also reinforce the need for compliance with treatment. Thanks,   Star Age, MD, PhD [facility])

## 2021-05-25 NOTE — Telephone Encounter (Signed)
I called pt. I advised pt that Dr. Rexene Alberts reviewed their sleep study results and found that pt has moderate osa. Dr. Rexene Alberts recommends that pt start on autopap for treatment. I reviewed PAP compliance expectations with the pt. Pt is agreeable to starting an auto-PAP. I advised pt that an order will be sent to a DME, [facility], and [facility] will call the pt within about one week after they file with the pt's insurance. [facility] will show the pt how to use the machine, fit for masks, and troubleshoot the auto-PAP if needed. Pt will cb once she has started her auto-pap machine and schedule 31-90 day f/u. She understands f/u must take place during this time frame for insurance purposes. I verified with the pt that the address we have on file is correct. Pt verbalized understanding of results. Pt had no questions at this time but was encouraged to call back if questions arise. I have sent the order to [employer] and have received confirmation that they have received the order.

## 2021-06-06 ENCOUNTER — Other Ambulatory Visit: Payer: Self-pay | Admitting: Internal Medicine

## 2021-06-06 DIAGNOSIS — Z1231 Encounter for screening mammogram for malignant neoplasm of breast: Secondary | ICD-10-CM

## 2021-06-15 ENCOUNTER — Emergency Department (HOSPITAL_COMMUNITY)
Admission: EM | Admit: 2021-06-15 | Discharge: 2021-06-15 | Disposition: A | Discharge status: Home / Self Care | Payer: 59 | Attending: Emergency Medicine | Admitting: Emergency Medicine

## 2021-06-15 ENCOUNTER — Emergency Department (HOSPITAL_COMMUNITY): Payer: 59

## 2021-06-15 ENCOUNTER — Encounter (HOSPITAL_COMMUNITY): Payer: Self-pay

## 2021-06-15 DIAGNOSIS — R0789 Other chest pain: Secondary | ICD-10-CM | POA: Diagnosis not present

## 2021-06-15 DIAGNOSIS — K219 Gastro-esophageal reflux disease without esophagitis: Secondary | ICD-10-CM | POA: Diagnosis not present

## 2021-06-15 DIAGNOSIS — Z87891 Personal history of nicotine dependence: Secondary | ICD-10-CM | POA: Diagnosis not present

## 2021-06-15 DIAGNOSIS — J45909 Unspecified asthma, uncomplicated: Secondary | ICD-10-CM | POA: Diagnosis not present

## 2021-06-15 DIAGNOSIS — R0602 Shortness of breath: Secondary | ICD-10-CM | POA: Diagnosis not present

## 2021-06-15 DIAGNOSIS — R918 Other nonspecific abnormal finding of lung field: Secondary | ICD-10-CM | POA: Diagnosis not present

## 2021-06-15 DIAGNOSIS — R079 Chest pain, unspecified: Secondary | ICD-10-CM | POA: Diagnosis present

## 2021-06-15 DIAGNOSIS — R9389 Abnormal findings on diagnostic imaging of other specified body structures: Secondary | ICD-10-CM

## 2021-06-15 LAB — BASIC METABOLIC PANEL
Anion gap: 12 (ref 5–15)
BUN: 9 mg/dL (ref 6–20)
CO2: 26 mmol/L (ref 22–32)
Calcium: 9.7 mg/dL (ref 8.9–10.3)
Chloride: 102 mmol/L (ref 98–111)
Creatinine, Ser: 0.72 mg/dL (ref 0.44–1.00)
GFR, Estimated: >60 mL/min (ref >60)
Glucose, Bld: 114 mg/dL — ABNORMAL HIGH (ref 70–99)
Potassium: 3.4 mmol/L — ABNORMAL LOW (ref 3.5–5.1)
Sodium: 140 mmol/L (ref 135–145)

## 2021-06-15 LAB — CBC
HCT: 39.3 % (ref 36.0–46.0)
Hemoglobin: 12.4 g/dL (ref 12.0–15.0)
MCH: 26.8 pg (ref 26.0–34.0)
MCHC: 31.6 g/dL (ref 30.0–36.0)
MCV: 85.1 fL (ref 80.0–100.0)
Platelets: 278 10*3/uL (ref 150–400)
RBC: 4.62 MIL/uL (ref 3.87–5.11)
RDW: 15.5 % (ref 11.5–15.5)
WBC: 9.5 10*3/uL (ref 4.0–10.5)
nRBC: 0 % (ref 0.0–0.2)

## 2021-06-15 LAB — TROPONIN I (HIGH SENSITIVITY)
Troponin I (High Sensitivity): 3 ng/L (ref <18)
Troponin I (High Sensitivity): 3 ng/L (ref <18)

## 2021-06-15 MED ORDER — IOHEXOL 350 MG/ML SOLN
80.0000 mL | Freq: Once | INTRAVENOUS | Status: AC | PRN
  Administered 2021-06-15: 80 mL via INTRAVENOUS

## 2021-06-15 MED ORDER — PANTOPRAZOLE SODIUM 20 MG PO TBEC: 20.0000 mg | DELAYED_RELEASE_TABLET | Freq: Every day | ORAL | 0 refills | Status: DC

## 2021-06-15 MED ORDER — ALUM & MAG HYDROXIDE-SIMETH 200-200-20 MG/5ML PO SUSP
30.0000 mL | Freq: Once | ORAL | Status: AC
  Administered 2021-06-15: 30 mL via ORAL
  Filled 2021-06-15: qty 30

## 2021-06-15 MED ORDER — MORPHINE SULFATE (PF) 4 MG/ML IV SOLN
4.0000 mg | Freq: Once | INTRAVENOUS | Status: AC
  Administered 2021-06-15: 4 mg via INTRAVENOUS
  Filled 2021-06-15: qty 1

## 2021-06-15 MED ORDER — LIDOCAINE VISCOUS HCL 2 % MT SOLN
15.0000 mL | Freq: Once | OROMUCOSAL | Status: AC
  Administered 2021-06-15: 15 mL via ORAL
  Filled 2021-06-15: qty 15

## 2021-06-15 NOTE — ED Provider Notes (Signed)
[facility] DEPT Provider Note   CSN: [id number] Arrival date & time: 06/15/21  0515     History Chief Complaint  Patient presents with   Chest Pain   Shortness of Breath    Jill Davis is a 55 y.o. female.  The history is provided by the patient and medical records. No language interpreter was used.  Chest Pain Associated symptoms: shortness of breath   Shortness of Breath Associated symptoms: chest pain    55 year old female significant history of GERD, anxiety, asthma, obesity substance abuse who presents for evaluation of chest pain.  Patient reports she was awoke approximately 2 hours ago with pain to her chest.  Pain is primarily to the midsternal region and radiates towards her left breast described as a sharp stabbing sensation.  States pain initially was very intense, rated as 15 out of 10, endorse some lightheadedness shortness of breath and nausea.  She immediately took 3 Tums but noticed no improvement.  States this pain felt different from her usual GERD pain that usually relieved with Tums.  Denies any fever productive cough abdominal pain back pain or rash.  Denies any recent strenuous activities and denies any drug use.  Does have family history of cardiac disease.  Currently rates the pain as 9 out of 10.  Past Medical History:  Diagnosis Date   Allergy    grass    Anemia    Anxiety    Arthritis    knees x 2   and hip on left    Asthma    none since moved to [place]    Depression    GERD (gastroesophageal reflux disease)    PRn Rolaids -  gets with red sauces etc    Heart murmur    as baby    History of substance abuse ([place])    Insomnia    Morbid obesity ([place])    Neuromuscular disorder ([place])    plantar fascitis    Substance abuse ([place])    Thyroid disease    currently no meds     Patient Active Problem List   Diagnosis Date Noted   Polyp of descending colon    Vitamin D deficiency 07/08/2020   Morbid obesity ([place])     Depression    Insomnia    History of substance abuse ([place])     Past Surgical History:  Procedure Laterality Date   ABDOMINAL HYSTERECTOMY     BREAST BIOPSY Left 11/21/2015   benign   COLONOSCOPY WITH PROPOFOL N/A 10/05/2020   Procedure: COLONOSCOPY WITH PROPOFOL;  Surgeon: Thornton Park, MD;  Location: [facility];  Service: Gastroenterology;  Laterality: N/A;   cyst on buttocks      PARTIAL HYSTERECTOMY     prior to abd full hysterectomy   POLYPECTOMY  10/05/2020   Procedure: POLYPECTOMY;  Surgeon: Thornton Park, MD;  Location: [facility];  Service: Gastroenterology;;     OB History   No obstetric history on file.     Family History  Problem Relation Age of Onset   Diabetes Mother    Hypertension Mother    CAD Father    Hypertension Father    Breast cancer Sister    Hypertension Brother    Colon cancer Neg Hx    Colon polyps Neg Hx    Esophageal cancer Neg Hx    Stomach cancer Neg Hx    Rectal cancer Neg Hx     Social History   Tobacco Use  Smoking status: Former   Smokeless tobacco: Never  Substance Use Topics   Alcohol use: Not Currently   Drug use: Yes    Types: Cocaine, Marijuana, "Crack" cocaine, Heroin    Comment: quit 1990    Home Medications Prior to Admission medications   Medication Sig Start Date End Date Taking? Authorizing Provider  citalopram (CELEXA) 20 MG tablet TAKE 1 TABLET BY MOUTH EVERY DAY 12/07/20   Isaac Bliss, Rayford Halsted, MD  diphenhydramine-acetaminophen (TYLENOL PM) 25-500 MG TABS tablet Take 2 tablets by mouth at bedtime as needed (sleep.).    [provider]  ibuprofen (ADVIL) 200 MG tablet Take 1,000 mg by mouth every 8 (eight) hours as needed (for pain.).     [provider]  ofloxacin (OCUFLOX) 0.3 % ophthalmic solution Place 1 drop into the left eye 4 (four) times daily. 05/12/21   Isaac Bliss, Rayford Halsted, MD  triamcinolone cream (KENALOG) 0.1 % Apply 1 application topically 2 (two) times  daily. 05/12/21   Isaac Bliss, Rayford Halsted, MD  Vitamin D, Ergocalciferol, (DRISDOL) 1.25 MG (50000 UNIT) CAPS capsule Take 1 capsule (50,000 Units total) by mouth every 7 (seven) days for 12 doses. 05/12/21 07/29/21  Isaac Bliss, Rayford Halsted, MD  zolpidem (AMBIEN) 10 MG tablet Take 1 tablet (10 mg total) by mouth at bedtime as needed for sleep. 05/12/21 06/11/21  Erline Hau, MD    Allergies    Celebrex [celecoxib]  Review of Systems   Review of Systems  Respiratory:  Positive for shortness of breath.   Cardiovascular:  Positive for chest pain.  All other systems reviewed and are negative.  Physical Exam Updated Vital Signs BP (!) 157/105 (BP Location: Right Arm)   Pulse 66   Temp 98 F (36.7 C) (Oral)   Resp 16   SpO2 94%   Physical Exam Vitals and nursing note reviewed.  Constitutional:      General: She is not in acute distress.    Appearance: She is well-developed. She is obese.  HENT:     Head: Atraumatic.  Eyes:     Conjunctiva/sclera: Conjunctivae normal.  Cardiovascular:     Rate and Rhythm: Normal rate and regular rhythm.     Pulses: Normal pulses.     Heart sounds: Normal heart sounds.  Pulmonary:     Effort: Pulmonary effort is normal.  Chest:     Chest wall: Tenderness (Reproducible tenderness to chest wall on palpation without overlying skin changes or rash) present.  Abdominal:     Palpations: Abdomen is soft.     Tenderness: There is no abdominal tenderness.  Musculoskeletal:     Cervical back: Neck supple.  Skin:    Findings: No rash.  Neurological:     Mental Status: She is alert. Mental status is at baseline.  Psychiatric:        Mood and Affect: Mood normal.    ED Results / Procedures / Treatments   Labs (all labs ordered are listed, but only abnormal results are displayed) Labs Reviewed  BASIC METABOLIC PANEL - Abnormal; Notable for the following components:      Result Value   Potassium 3.4 (*)    Glucose, Bld 114 (*)     All other components within normal limits  CBC  TROPONIN I (HIGH SENSITIVITY)  TROPONIN I (HIGH SENSITIVITY)    EKG None  Date: 06/15/2021  Rate: 68  Rhythm: normal sinus rhythm  QRS Axis: normal  Intervals: normal  ST/T Wave  abnormalities: normal  Conduction Disutrbances: none  Narrative Interpretation:   Old EKG Reviewed: No significant changes noted EKG reviewed and interpreted by me   Radiology CT Angio Chest PE W and/or Wo Contrast  Result Date: 06/15/2021 CLINICAL DATA:  Chest pain EXAM: CT ANGIOGRAPHY CHEST WITH CONTRAST TECHNIQUE: Multidetector CT imaging of the chest was performed using the standard protocol during bolus administration of intravenous contrast. Multiplanar CT image reconstructions and MIPs were obtained to evaluate the vascular anatomy. CONTRAST:  80 mL OMNIPAQUE IOHEXOL 350 MG/ML SOLN COMPARISON:  None. FINDINGS: Cardiovascular: No filling defects in the pulmonary arteries to suggest pulmonary emboli. Heart is normal size. Aorta is normal caliber. Mediastinum/Nodes: No mediastinal, hilar, or axillary adenopathy. Small scattered calcified mediastinal lymph nodes. Trachea and esophagus are unremarkable. 3 cm low-density lesion within the right thyroid lobe. Lungs/Pleura: Lungs are clear. No focal airspace opacities or suspicious nodules. No effusions. Upper Abdomen: Calcifications in the spleen compatible with old granulomatous disease. No acute findings. Musculoskeletal: Chest wall soft tissues are unremarkable. No acute bony abnormality. Review of the MIP images confirms the above findings. IMPRESSION: No evidence of pulmonary embolus. No acute cardiopulmonary disease. 3 cm right thyroid mass. Recommend thyroid US (ref: J Am Coll Radiol. 2015 Feb;12(2): 143-50). Electronically Signed   By: Rolm Baptise M.D.   On: 06/15/2021 10:49   DG Chest Port 1 View  Result Date: 06/15/2021 CLINICAL DATA:  Chest pain EXAM: PORTABLE CHEST 1 VIEW COMPARISON:  None. FINDINGS: The  heart size and mediastinal contours are within normal limits. Questionable right suprahilar nodular opacities. The visualized skeletal structures are unremarkable. IMPRESSION: 1. Questionable right suprahilar nodular opacities may represent pulmonary arterial vasculature imaged en face. Recommend dedicated PA and lateral chest x-ray when the patient is able. 2. Otherwise, no acute cardiopulmonary process. Electronically Signed   By: Jacqulynn Cadet M.D.   On: 06/15/2021 08:06    Procedures Procedures   Medications Ordered in ED Medications - No data to display  ED Course  I have reviewed the triage vital signs and the nursing notes.  Pertinent labs & imaging results that were available during my care of the patient were reviewed by me and considered in my medical decision making (see chart for details).    MDM Rules/Calculators/A&P                           BP 117/63   Pulse 62   Temp 98 F (36.7 C) (Oral)   Resp 18   SpO2 92%   Final Clinical Impression(s) / ED Diagnoses Final diagnoses:  Atypical chest pain  Abnormal chest CT    Rx / DC Orders ED Discharge Orders          Ordered    pantoprazole (PROTONIX) 20 MG tablet  Daily        06/15/21 1149           7:30 AM Patient here with chest pain that woke up from sleep.  Pain seems to be reproducible on exam.  Significant history of GERD but states Tums was taken without relief.  Work-up initiated, will give GI cocktail.  11:01 AM Chest CT angiogram obtained today without any evidence of PE and no acute cardiopulmonary disease however there is a 3 cm right thyroid mass.  This is not likely related to patient's presenting complaint however she would need a thyroid ultrasound for further evaluation.  I discussed this with patient and she will  need to follow with a doctor for further work-up.  11:35 AM EKG unremarkable, negative delta troponin, labs overall are reassuring, initial chest x-ray shows questionable right  suprahilar nodular opacity, which may represent pulmonary arterial vasculature.  Chest CT angiogram obtained without any evidence of PE but as mentioned earlier, there is a 3 cm right thyroid mass the patient would need to have a follow-up.  Patient reports she is aware of the thyroid situation and she has been dealing with it for the past 8 years.  I encourage patient to discuss this with her PCP for closer monitoring.  Patient did report some improvement after GI cocktail.  Will discharge home with a PPI.   Domenic Moras, PA-C 06/17/21 2204    Davonna Belling, MD 06/24/21 [phone]

## 2021-06-15 NOTE — ED Triage Notes (Signed)
Pt reports that about an hr ago she began to have severe chest pain that begins in the middle of her chest and wraps under her left breast. Pt states that she will usually take a Tums but this time is different. Pt also has shortness of breath with activity.

## 2021-06-15 NOTE — Discharge Instructions (Addendum)
You have been evaluated for your chest pain.  Fortunately no acute life-threatening emergency was identified during this visit.  No evidence of blood clot in your lungs or signs of heart attack.  Incidentally chest CT scan shows a 3 cm right thyroid mass.  Discussed this with your primary care doctor for further care

## 2021-06-23 ENCOUNTER — Encounter: Payer: Self-pay | Admitting: Internal Medicine

## 2021-06-23 ENCOUNTER — Telehealth: Payer: Self-pay | Admitting: Neurology

## 2021-06-23 ENCOUNTER — Other Ambulatory Visit: Payer: Self-pay

## 2021-06-23 ENCOUNTER — Ambulatory Visit: Payer: 59 | Admitting: Internal Medicine

## 2021-06-23 VITALS — BP 114/78 | Temp 97.6°F | Wt 298.2 lb

## 2021-06-23 DIAGNOSIS — E559 Vitamin D deficiency, unspecified: Secondary | ICD-10-CM | POA: Diagnosis not present

## 2021-06-23 DIAGNOSIS — Z09 Encounter for follow-up examination after completed treatment for conditions other than malignant neoplasm: Secondary | ICD-10-CM

## 2021-06-23 DIAGNOSIS — E041 Nontoxic single thyroid nodule: Secondary | ICD-10-CM | POA: Diagnosis not present

## 2021-06-23 DIAGNOSIS — G4733 Obstructive sleep apnea (adult) (pediatric): Secondary | ICD-10-CM

## 2021-06-23 LAB — VITAMIN D 25 HYDROXY (VIT D DEFICIENCY, FRACTURES): VITD: 37.28 ng/mL (ref 30.00–100.00)

## 2021-06-23 LAB — T4, FREE: Free T4: 0.8 ng/dL (ref 0.60–1.60)

## 2021-06-23 LAB — T3, FREE: T3, Free: 3.8 pg/mL (ref 2.3–4.2)

## 2021-06-23 LAB — TSH: TSH: 3.2 u[IU]/mL (ref 0.35–5.50)

## 2021-06-23 NOTE — Progress Notes (Signed)
Established Patient Office Visit     This visit occurred during the SARS-CoV-2 public health emergency.  Safety protocols were in place, including screening questions prior to the visit, additional usage of staff PPE, and extensive cleaning of exam room while observing appropriate contact time as indicated for disinfecting solutions.    CC/Reason for Visit: ED follow-up  HPI: Jill Davis is a 55 y.o. female who is coming in today for the above mentioned reasons.  She was seen in the emergency department on July 27 for chest pain.  All work-up was negative including delta troponins, CT angiogram of the chest and lab work.  She was discharged home.  She has been feeling well since without recurrence.  On the CT she was found to have an incidental 3 cm right-sided thyroid mass and was asked to follow up with me regarding this.  She has also been diagnosed with obstructive sleep apnea however the cost of renting the machine appears to be cost prohibitive to her.  Past Medical/Surgical History: Past Medical History:  Diagnosis Date   Allergy    grass    Anemia    Anxiety    Arthritis    knees x 2   and hip on left    Asthma    none since moved to [place]    Depression    GERD (gastroesophageal reflux disease)    PRn Rolaids -  gets with red sauces etc    Heart murmur    as baby    History of substance abuse ([place])    Insomnia    Morbid obesity ([place])    Neuromuscular disorder ([place])    plantar fascitis    Substance abuse ([place])    Thyroid disease    currently no meds     Past Surgical History:  Procedure Laterality Date   ABDOMINAL HYSTERECTOMY     BREAST BIOPSY Left 11/21/2015   benign   COLONOSCOPY WITH PROPOFOL N/A 10/05/2020   Procedure: COLONOSCOPY WITH PROPOFOL;  Surgeon: Thornton Park, MD;  [facility]: [facility];  Service: Gastroenterology;  Laterality: N/A;   cyst on buttocks      PARTIAL HYSTERECTOMY     prior to abd full hysterectomy   POLYPECTOMY   10/05/2020   Procedure: POLYPECTOMY;  Surgeon: Thornton Park, MD;  [facility];  Service: Gastroenterology;;    Social History:  reports that she has quit smoking. She has never used smokeless tobacco. She reports previous alcohol use. She reports current drug use. Drugs: Cocaine, Marijuana, "Crack" cocaine, and Heroin.  Allergies: Allergies  Allergen Reactions   Celebrex [Celecoxib] Itching    Family History:  Family History  Problem Relation Age of Onset   Diabetes Mother    Hypertension Mother    CAD Father    Hypertension Father    Breast cancer Sister    Hypertension Brother    Colon cancer Neg Hx    Colon polyps Neg Hx    Esophageal cancer Neg Hx    Stomach cancer Neg Hx    Rectal cancer Neg Hx      Current Outpatient Medications:    citalopram (CELEXA) 20 MG tablet, TAKE 1 TABLET BY MOUTH EVERY DAY, Disp: 90 tablet, Rfl: 1   diphenhydramine-acetaminophen (TYLENOL PM) 25-500 MG TABS tablet, Take 2 tablets by mouth at bedtime as needed (sleep.)., Disp: , Rfl:    ibuprofen (ADVIL) 200 MG tablet, Take 1,000 mg by mouth every 8 (eight) hours as needed (for pain.). ,  Disp: , Rfl:    ofloxacin (OCUFLOX) 0.3 % ophthalmic solution, Place 1 drop into the left eye 4 (four) times daily., Disp: 5 mL, Rfl: 0   pantoprazole (PROTONIX) 20 MG tablet, Take 1 tablet (20 mg total) by mouth daily., Disp: 30 tablet, Rfl: 0   triamcinolone cream (KENALOG) 0.1 %, Apply 1 application topically 2 (two) times daily., Disp: 30 g, Rfl: 0   Vitamin D, Ergocalciferol, (DRISDOL) 1.25 MG (50000 UNIT) CAPS capsule, Take 1 capsule (50,000 Units total) by mouth every 7 (seven) days for 12 doses., Disp: 12 capsule, Rfl: 0   zolpidem (AMBIEN) 10 MG tablet, Take 1 tablet (10 mg total) by mouth at bedtime as needed for sleep., Disp: 15 tablet, Rfl: 1  Review of Systems:  Constitutional: Denies fever, chills, diaphoresis, appetite change and fatigue.  HEENT: Denies photophobia, eye pain,  redness, hearing loss, ear pain, congestion, sore throat, rhinorrhea, sneezing, mouth sores, trouble swallowing, neck pain, neck stiffness and tinnitus.   Respiratory: Denies SOB, DOE, cough, chest tightness,  and wheezing.   Cardiovascular: Denies chest pain, palpitations and leg swelling.  Gastrointestinal: Denies nausea, vomiting, abdominal pain, diarrhea, constipation, blood in stool and abdominal distention.  Genitourinary: Denies dysuria, urgency, frequency, hematuria, flank pain and difficulty urinating.  Endocrine: Denies: hot or cold intolerance, sweats, changes in hair or nails, polyuria, polydipsia. Musculoskeletal: Denies myalgias, back pain, joint swelling, arthralgias and gait problem.  Skin: Denies pallor, rash and wound.  Neurological: Denies dizziness, seizures, syncope, weakness, light-headedness, numbness and headaches.  Hematological: Denies adenopathy. Easy bruising, personal or family bleeding history  Psychiatric/Behavioral: Denies suicidal ideation, mood changes, confusion, nervousness, sleep disturbance and agitation    Physical Exam: Vitals:   06/23/21 0738  BP: 114/78  Temp: 97.6 F (36.4 C)  TempSrc: Oral  SpO2: 98%  Weight: 298 lb 3.2 oz (135.3 kg)    Body mass index is 50.4 kg/m.   Constitutional: NAD, calm, comfortable, obese Eyes: PERRL, lids and conjunctivae normal ENMT: Mucous membranes are moist.  Neck: normal, supple, no masses, no thyromegaly Respiratory: clear to auscultation bilaterally, no wheezing, no crackles. Normal respiratory effort. No accessory muscle use.  Cardiovascular: Regular rate and rhythm, no murmurs / rubs / gallops. No extremity edema.  Neurologic: Grossly intact and nonfocal Psychiatric: Normal judgment and insight. Alert and oriented x 3. Normal mood.    Impression and Plan:  Hospital discharge follow-up  Thyroid nodule greater than or equal to 1 cm in diameter incidentally noted on imaging study - Plan: T3, free,  T4, free, TSH, US THYROID, TSH, T4, free, T3, free  OSA (obstructive sleep apnea)  -Hospital charts have been reviewed in detail. -Will send for thyroid ultrasound to further assess the 3 cm right thyroid mass seen on CT scan. -In regards to her sleep apnea, have asked her to follow-up with the DME company and the neurologist to see if they have any leads on more affordable ways to get the CPAP machine as unfortunately I have no further information for her.  Time spent: 31 minutes reviewing chart, interviewing and examining patient and formulating plan of care.    [name], MD [facility]

## 2021-06-23 NOTE — Telephone Encounter (Signed)
Pt. Called and left a voice mail needing to speak with someone regarding her sleep study results and mentioned having a few questions. Please reach out to patient.

## 2021-06-23 NOTE — Telephone Encounter (Signed)
Spoke with patient.  She has concerns because she was told that she owes a $320 deposit that must be paid in full before she can receive her machine and then her cost is $44 per month.  Patient states she cannot afford this.  I let her know I will look into this to see if we can get further explanation.  She verbalized appreciation.   Secure message sent to [employer] regarding pt's concern for cost and if there is an assistance program patient can apply for.

## 2021-06-24 ENCOUNTER — Other Ambulatory Visit: Payer: Self-pay | Admitting: Internal Medicine

## 2021-06-24 DIAGNOSIS — F331 Major depressive disorder, recurrent, moderate: Secondary | ICD-10-CM

## 2021-06-27 NOTE — Telephone Encounter (Signed)
Received receipt of confirmation of message from [place].

## 2021-06-28 ENCOUNTER — Encounter: Payer: Self-pay | Admitting: *Deleted

## 2021-07-11 ENCOUNTER — Encounter: Payer: Self-pay | Admitting: Internal Medicine

## 2021-07-11 DIAGNOSIS — G4700 Insomnia, unspecified: Secondary | ICD-10-CM

## 2021-07-12 MED ORDER — ZOLPIDEM TARTRATE 10 MG PO TABS: 10.0000 mg | ORAL_TABLET | Freq: Every evening | ORAL | 1 refills | Status: DC | PRN

## 2021-07-15 ENCOUNTER — Other Ambulatory Visit: Payer: 59

## 2021-07-19 ENCOUNTER — Other Ambulatory Visit: Payer: Self-pay | Admitting: Internal Medicine

## 2021-07-19 ENCOUNTER — Ambulatory Visit
Admission: RE | Admit: 2021-07-19 | Discharge: 2021-07-19 | Disposition: A | Discharge status: Home / Self Care | Payer: 59 | Source: Ambulatory Visit | Attending: Internal Medicine | Admitting: Internal Medicine

## 2021-07-19 DIAGNOSIS — E041 Nontoxic single thyroid nodule: Secondary | ICD-10-CM

## 2021-08-04 ENCOUNTER — Ambulatory Visit
Admission: RE | Admit: 2021-08-04 | Discharge: 2021-08-04 | Disposition: A | Discharge status: Home / Self Care | Payer: 59 | Source: Ambulatory Visit | Attending: Internal Medicine | Admitting: Internal Medicine

## 2021-08-04 ENCOUNTER — Other Ambulatory Visit: Payer: Self-pay

## 2021-08-04 ENCOUNTER — Other Ambulatory Visit (HOSPITAL_COMMUNITY)
Admission: RE | Admit: 2021-08-04 | Discharge: 2021-08-04 | Disposition: A | Discharge status: Home / Self Care | Payer: 59 | Source: Ambulatory Visit | Attending: Internal Medicine | Admitting: Internal Medicine

## 2021-08-04 DIAGNOSIS — Z1231 Encounter for screening mammogram for malignant neoplasm of breast: Secondary | ICD-10-CM

## 2021-08-04 DIAGNOSIS — D34 Benign neoplasm of thyroid gland: Secondary | ICD-10-CM | POA: Diagnosis not present

## 2021-08-04 DIAGNOSIS — E041 Nontoxic single thyroid nodule: Secondary | ICD-10-CM | POA: Diagnosis present

## 2021-08-08 ENCOUNTER — Encounter: Payer: Self-pay | Admitting: Internal Medicine

## 2021-08-08 LAB — CYTOLOGY - NON PAP

## 2021-08-27 ENCOUNTER — Other Ambulatory Visit: Payer: Self-pay | Admitting: Internal Medicine

## 2021-08-27 DIAGNOSIS — E559 Vitamin D deficiency, unspecified: Secondary | ICD-10-CM

## 2021-08-27 DIAGNOSIS — G4700 Insomnia, unspecified: Secondary | ICD-10-CM

## 2021-08-29 NOTE — Telephone Encounter (Signed)
Last refill per controlled substance database: 08/05/21 Last OV: 06/23/21 Next OV: none scheduled

## 2021-09-04 ENCOUNTER — Other Ambulatory Visit: Payer: Self-pay | Admitting: Internal Medicine

## 2021-09-04 DIAGNOSIS — E559 Vitamin D deficiency, unspecified: Secondary | ICD-10-CM

## 2021-09-22 ENCOUNTER — Encounter: Payer: Self-pay | Admitting: Internal Medicine

## 2021-09-28 ENCOUNTER — Other Ambulatory Visit: Payer: Self-pay | Admitting: Internal Medicine

## 2021-09-28 DIAGNOSIS — E559 Vitamin D deficiency, unspecified: Secondary | ICD-10-CM

## 2021-11-07 ENCOUNTER — Other Ambulatory Visit: Payer: Self-pay | Admitting: Internal Medicine

## 2021-11-07 DIAGNOSIS — E559 Vitamin D deficiency, unspecified: Secondary | ICD-10-CM

## 2021-11-07 DIAGNOSIS — G4700 Insomnia, unspecified: Secondary | ICD-10-CM

## 2021-12-21 ENCOUNTER — Ambulatory Visit: Payer: Self-pay | Admitting: Internal Medicine

## 2021-12-22 ENCOUNTER — Other Ambulatory Visit: Payer: Self-pay

## 2021-12-22 ENCOUNTER — Ambulatory Visit (INDEPENDENT_AMBULATORY_CARE_PROVIDER_SITE_OTHER): Payer: 59

## 2021-12-22 ENCOUNTER — Ambulatory Visit: Payer: 59 | Admitting: Internal Medicine

## 2021-12-22 ENCOUNTER — Encounter: Payer: Self-pay | Admitting: Internal Medicine

## 2021-12-22 VITALS — BP 110/70 | HR 67 | Temp 98.1°F | Wt 298.3 lb

## 2021-12-22 DIAGNOSIS — R6889 Other general symptoms and signs: Secondary | ICD-10-CM

## 2021-12-22 NOTE — Progress Notes (Signed)
Acute office Visit     This visit occurred during the SARS-CoV-2 public health emergency.  Safety protocols were in place, including screening questions prior to the visit, additional usage of staff PPE, and extensive cleaning of exam room while observing appropriate contact time as indicated for disinfecting solutions.    CC/Reason for Visit: [place] symptoms  HPI: Jill Davis is a 56 y.o. female who is coming in today for the above mentioned reasons.  For the past 9 days she has been experiencing cough, nasal congestion and rhinorrhea, body aches.  She has done a multitude of over-the-counter treatments.  She has also felt a little short of breath.  Past Medical/Surgical History: Past Medical History:  Diagnosis Date   Allergy    grass    Anemia    Anxiety    Arthritis    knees x 2   and hip on left    Asthma    none since moved to [place]    Depression    GERD (gastroesophageal reflux disease)    PRn Rolaids -  gets with red sauces etc    Heart murmur    as baby    History of substance abuse ([place])    Insomnia    Morbid obesity ([place])    Neuromuscular disorder ([place])    plantar fascitis    Substance abuse ([place])    Thyroid disease    currently no meds     Past Surgical History:  Procedure Laterality Date   ABDOMINAL HYSTERECTOMY     BREAST BIOPSY Left 11/21/2015   benign   COLONOSCOPY WITH PROPOFOL N/A 10/05/2020   Procedure: COLONOSCOPY WITH PROPOFOL;  Surgeon: Thornton Park, MD;  [facility];  Service: Gastroenterology;  Laterality: N/A;   cyst on buttocks      PARTIAL HYSTERECTOMY     prior to abd full hysterectomy   POLYPECTOMY  10/05/2020   Procedure: POLYPECTOMY;  Surgeon: Thornton Park, MD;  [facility];  Service: Gastroenterology;;    Social History:  reports that she has quit smoking. She has never used smokeless tobacco. She reports that she does not currently use alcohol. She reports current drug use. Drugs: Cocaine,  Marijuana, "Crack" cocaine, and Heroin.  Allergies: Allergies  Allergen Reactions   Celebrex [Celecoxib] Itching    Family History:  Family History  Problem Relation Age of Onset   Diabetes Mother    Hypertension Mother    CAD Father    Hypertension Father    Breast cancer Sister    Hypertension Brother    Colon cancer Neg Hx    Colon polyps Neg Hx    Esophageal cancer Neg Hx    Stomach cancer Neg Hx    Rectal cancer Neg Hx      Current Outpatient Medications:    citalopram (CELEXA) 20 MG tablet, TAKE 1 TABLET BY MOUTH EVERY DAY, Disp: 90 tablet, Rfl: 1   diphenhydramine-acetaminophen (TYLENOL PM) 25-500 MG TABS tablet, Take 2 tablets by mouth at bedtime as needed (sleep.)., Disp: , Rfl:    ibuprofen (ADVIL) 200 MG tablet, Take 1,000 mg by mouth every 8 (eight) hours as needed (for pain.). , Disp: , Rfl:    ofloxacin (OCUFLOX) 0.3 % ophthalmic solution, Place 1 drop into the left eye 4 (four) times daily., Disp: 5 mL, Rfl: 0   triamcinolone cream (KENALOG) 0.1 %, Apply 1 application topically 2 (two) times daily., Disp: 30 g, Rfl: 0   Vitamin D, Ergocalciferol, (DRISDOL)  1.25 MG (50000 UNIT) CAPS capsule, TAKE 1 CAPSULE (50,000 UNITS TOTAL) BY MOUTH EVERY 7 (SEVEN) DAYS FOR 12 DOSES., Disp: 12 capsule, Rfl: 0   zolpidem (AMBIEN) 10 MG tablet, TAKE 1 TABLET BY MOUTH EVERY DAY AT BEDTIME AS NEEDED FOR SLEEP, Disp: 15 tablet, Rfl: 1  Review of Systems:  Constitutional: Denies fever, chills, diaphoresis. HEENT: Denies photophobia, eye pain, redness, mouth sores, trouble swallowing, neck pain, neck stiffness and tinnitus.   Respiratory: Denies  chest tightness,  and wheezing.   Cardiovascular: Denies chest pain, palpitations and leg swelling.  Gastrointestinal: Denies nausea, vomiting, abdominal pain, diarrhea, constipation, blood in stool and abdominal distention.  Genitourinary: Denies dysuria, urgency, frequency, hematuria, flank pain and difficulty urinating.  Endocrine:  Denies: hot or cold intolerance, sweats, changes in hair or nails, polyuria, polydipsia. Musculoskeletal: Denies myalgias, back pain, joint swelling, arthralgias and gait problem.  Skin: Denies pallor, rash and wound.  Neurological: Denies dizziness, seizures, syncope, weakness, light-headedness, numbness and headaches.  Hematological: Denies adenopathy. Easy bruising, personal or family bleeding history  Psychiatric/Behavioral: Denies suicidal ideation, mood changes, confusion, nervousness, sleep disturbance and agitation    Physical Exam: Vitals:   12/22/21 0807  BP: 110/70  Pulse: 67  Temp: 98.1 F (36.7 C)  TempSrc: Oral  SpO2: 99%  Weight: 298 lb 4.8 oz (135.3 kg)    Body mass index is 50.41 kg/m.   Constitutional: NAD, calm, comfortable, obese Eyes: PERRL, lids and conjunctivae normal ENMT: Mucous membranes are moist.  Posterior pharynx is erythematous but no exudates, tympanic membranes are clear bilaterally. Respiratory: clear to auscultation bilaterally, no wheezing, no crackles. Normal respiratory effort. No accessory muscle use.  Cardiovascular: Regular rate and rhythm, no murmurs / rubs / gallops. No extremity edema. Neurologic: Grossly intact and nonfocal Psychiatric: Normal judgment and insight. Alert and oriented x 3. Normal mood.    Impression and Plan:  Flu-like symptoms  - Plan: DG Chest 2 View -Given duration of symptoms, do not believe ordering flu or COVID tests would be beneficial. -Given her shortness of breath and cough will order chest x-ray.  If she has pneumonia we will treat with antibiotics. -Otherwise to have advised on OTC medication she can use.  If not better in 10 days she will come back to clinic for repeat evaluation.  Time spent: 31 minutes reviewing chart, interviewing and examining patient and formulating plan of care.     [name], MD [facility]

## 2021-12-23 ENCOUNTER — Encounter: Payer: Self-pay | Admitting: Internal Medicine

## 2021-12-24 ENCOUNTER — Other Ambulatory Visit: Payer: Self-pay | Admitting: Internal Medicine

## 2021-12-24 DIAGNOSIS — F331 Major depressive disorder, recurrent, moderate: Secondary | ICD-10-CM

## 2022-01-13 ENCOUNTER — Other Ambulatory Visit: Payer: Self-pay | Admitting: Internal Medicine

## 2022-01-13 DIAGNOSIS — G4700 Insomnia, unspecified: Secondary | ICD-10-CM

## 2022-01-17 ENCOUNTER — Encounter: Payer: Self-pay | Admitting: Internal Medicine

## 2022-02-04 ENCOUNTER — Emergency Department (HOSPITAL_COMMUNITY)
Admission: EM | Admit: 2022-02-04 | Discharge: 2022-02-04 | Disposition: A | Discharge status: Home / Self Care | Payer: 59 | Attending: Emergency Medicine | Admitting: Emergency Medicine

## 2022-02-04 ENCOUNTER — Other Ambulatory Visit: Payer: Self-pay

## 2022-02-04 DIAGNOSIS — Z79899 Other long term (current) drug therapy: Secondary | ICD-10-CM | POA: Diagnosis not present

## 2022-02-04 DIAGNOSIS — R040 Epistaxis: Secondary | ICD-10-CM

## 2022-02-04 NOTE — Discharge Instructions (Signed)
Likely your nosebleeds from possible nasal dryness, recommend using a humidifier as this will help keep the air moist.  If you develop another nosebleed I like you to pinch your nose and hold it for 10 minutes, if after the 10 minutes you still have bleeding please hold your nose again for another 10 more minutes,  may do this 3 times in a row if after the third time you still have a nosebleed, please come back in for reevaluation ? ? ?Please follow your PCP as needed ? ?Come back to the emergency department if you develop chest pain, shortness of breath, severe abdominal pain, uncontrolled nausea, vomiting, diarrhea. ? ?

## 2022-02-04 NOTE — ED Notes (Signed)
Patient reports no additional nasal bleeding.  Reports no current complaints.  ?

## 2022-02-04 NOTE — ED Provider Notes (Signed)
?[facility] DEPT ?Provider Note ? ? ?CSN: [id number] ?Arrival date & time: 02/04/22  7829 ? ?  ? ?History ? ?Chief Complaint  ?Patient presents with  ? Epistaxis  ? ? ?Jill Davis is a 56 y.o. female. ? ?HPI ? ?Patient with medical history including obesity, anxiety, polysubstance dependency presents to the emergency department with chief complaint of epistasis.  Patient states that epistasis started earlier this morning, states that she was laying in bed and noticed blood was coming from her nose, she got up and  blew out 2 large clots, she then had a large clot that came out of her mouth, states that she is never had this in the past.  She states that she was able to stop the bleeding and in her nose has not bled for the last hour and a half.  She is not on any anticoag's no antiplatelet therapy, no history of bleeding disorders.  She endorses that she has allergies and uses Flonase daily, she denies recent trauma to the area.  She has no other complaints. ? ?I have reviewed patient's chart she recently had a CBC performed on 07/27 last year which was unremarkable she has normal platelets and normal hemoglobin. ? ?Home Medications ?Prior to Admission medications   ?Medication Sig Start Date End Date Taking? Authorizing Provider  ?citalopram (CELEXA) 20 MG tablet TAKE 1 TABLET BY MOUTH EVERY DAY 12/26/21   Isaac Bliss, Rayford Halsted, MD  ?diphenhydramine-acetaminophen (TYLENOL PM) 25-500 MG TABS tablet Take 2 tablets by mouth at bedtime as needed (sleep.).    [provider]  ?ibuprofen (ADVIL) 200 MG tablet Take 1,000 mg by mouth every 8 (eight) hours as needed (for pain.).     [provider]  ?ofloxacin (OCUFLOX) 0.3 % ophthalmic solution Place 1 drop into the left eye 4 (four) times daily. 05/12/21   Isaac Bliss, Rayford Halsted, MD  ?triamcinolone cream (KENALOG) 0.1 % Apply 1 application topically 2 (two) times daily. 05/12/21   Isaac Bliss, Rayford Halsted, MD   ?zolpidem (AMBIEN) 10 MG tablet TAKE 1 TABLET BY MOUTH AT BEDTIME AS NEEDED FOR SLEEP 01/17/22   Isaac Bliss, Rayford Halsted, MD  ?   ? ?Allergies    ?Celebrex [celecoxib]   ? ?Review of Systems   ?Review of Systems  ?Constitutional:  Negative for chills and fever.  ?HENT:  Positive for nosebleeds.   ?Respiratory:  Negative for shortness of breath.   ?Cardiovascular:  Negative for chest pain.  ?Gastrointestinal:  Negative for abdominal pain.  ?Neurological:  Negative for headaches.  ? ?Physical Exam ?Updated Vital Signs ?BP (!) 148/87 (BP Location: Left Arm) Comment (BP Location): FOREARM  Pulse 64   Temp 97.7 ?F (36.5 ?C) (Oral)   Resp 18   SpO2 99%  ?Physical Exam ?Vitals and nursing note reviewed.  ?Constitutional:   ?   General: She is not in acute distress. ?   Appearance: She is not ill-appearing.  ?HENT:  ?   Head: Normocephalic and atraumatic.  ?   Nose: No congestion.  ?   Comments: Patient does not have an active nosebleed on my exam, both nasal passageways were patent and open, there is no signs of trauma present ?   Mouth/Throat:  ?   Mouth: Mucous membranes are moist.  ?   Pharynx: Oropharynx is clear. No oropharyngeal exudate or posterior oropharyngeal erythema.  ?Eyes:  ?   Conjunctiva/sclera: Conjunctivae normal.  ?Cardiovascular:  ?   Rate and Rhythm: Normal  rate and regular rhythm.  ?   Pulses: Normal pulses.  ?   Heart sounds: No murmur heard. ?  No friction rub. No gallop.  ?Pulmonary:  ?   Effort: No respiratory distress.  ?   Breath sounds: No wheezing, rhonchi or rales.  ?Skin: ?   General: Skin is warm and dry.  ?   Findings: No bruising.  ?Neurological:  ?   Mental Status: She is alert.  ?Psychiatric:     ?   Mood and Affect: Mood normal.  ? ? ?ED Results / Procedures / Treatments   ?Labs ?(all labs ordered are listed, but only abnormal results are displayed) ?Labs Reviewed - No data to display ? ?EKG ?None ? ?Radiology ?No results found. ? ?Procedures ?Procedures  ? ? ?Medications  Ordered in ED ?Medications - No data to display ? ?ED Course/ Medical Decision Making/ A&P ?  ?                        ?Medical Decision Making ? ?This patient presents to the ED for concern of epistasis, this involves an extensive number of treatment options, and is a complaint that carries with it a high risk of complications and morbidity.  The differential diagnosis includes bleeding disorder, anemia, hypertensive emergency ? ? ? ?Additional history obtained: ? ?Additional history obtained from external medical record ?External records from outside source obtained and reviewed including please see HPI for further detail ? ? ?Co morbidities that complicate the patient evaluation ? ?N/A ? ?Social Determinants of Health: ? ?N/A ? ? ? ?Lab Tests: ? ?I Ordered, and personally interpreted labs.  The pertinent results include: N/A ? ? ?Imaging Studies ordered: ? ?I ordered imaging studies including N/A ?I independently visualized and interpreted imaging which showed N/A ?I agree with the radiologist interpretation ? ? ?Cardiac Monitoring: ? ?The patient was maintained on a cardiac monitor.  I personally viewed and interpreted the cardiac monitored which showed an underlying rhythm of: N/A ? ? ?Medicines ordered and prescription drug management: ? ?I ordered medication including N/A ?I have reviewed the patients home medicines and have made adjustments as needed ? ?Critical Interventions: ? ?N/A ? ? ?Test Considered: ? ?Considered obtaining CBC but will defer as my suspicion for anemia and/or low platelets are very low at this time vital signs are reassuring no abnormal bruising present my exam ? ? ? ?Rule out ?Low suspicion for hypertensive emergency or urgency as blood pressure is not significantly elevated, does not endorse headaches change in vision paresthesia weakness of the lower extremities, denies any chest pain or abdominal pain.  I have low suspicion for bleeding disorder she has no abnormal bruising present,  patient had lab work performed which showed normal platelet counts.  I have low suspicion for symptomatic anemia requiring blood transfusion as she is hemodynamically stable.  ? ? ? ?Dispostion and problem list ? ?After consideration of the diagnostic results and the patients response to treatment, I feel that the patent would benefit from discharge. ? ?Epistasis-unclear etiology but likely from nasal dryness will recommend warm moist air, instructions how to stop future nosebleeds follow-up with PCP as needed gave strict return precautions ? ? ? ? ? ? ? ? ? ? ? ?Final Clinical Impression(s) / ED Diagnoses ?Final diagnoses:  ?None  ? ? ?Rx / DC Orders ?ED Discharge Orders   ? ? None  ? ?  ? ? ?  ?Marcello Fennel, PA-C ?  02/04/22 0424 ? ?  ?Fatima Blank, MD ?02/04/22 [phone] ? ?

## 2022-02-04 NOTE — ED Triage Notes (Signed)
Pt presents with nose bleed at home and endorses clots when she blew her nose as well as spitting up blood.  Pt states she has had nose bleeds before but this was "different' No blood thinners.  Pt's nose is not currently bleeding.  ?

## 2022-02-14 ENCOUNTER — Other Ambulatory Visit: Payer: Self-pay | Admitting: Internal Medicine

## 2022-02-14 DIAGNOSIS — E559 Vitamin D deficiency, unspecified: Secondary | ICD-10-CM

## 2022-03-28 ENCOUNTER — Other Ambulatory Visit: Payer: Self-pay | Admitting: Internal Medicine

## 2022-03-28 DIAGNOSIS — G4700 Insomnia, unspecified: Secondary | ICD-10-CM

## 2022-03-28 DIAGNOSIS — E559 Vitamin D deficiency, unspecified: Secondary | ICD-10-CM

## 2022-03-29 ENCOUNTER — Ambulatory Visit: Payer: 59 | Admitting: Internal Medicine

## 2022-03-30 ENCOUNTER — Ambulatory Visit: Payer: 59 | Admitting: Internal Medicine

## 2022-03-30 ENCOUNTER — Telehealth: Payer: Self-pay | Admitting: *Deleted

## 2022-03-30 ENCOUNTER — Encounter: Payer: Self-pay | Admitting: Internal Medicine

## 2022-03-30 DIAGNOSIS — G4733 Obstructive sleep apnea (adult) (pediatric): Secondary | ICD-10-CM

## 2022-03-30 NOTE — Telephone Encounter (Signed)
Pt's primary care reached out to our office. Pt had not started CPAP due to financial concerns. Would like to try a new DME company. I have spoken with the patient. We will try [facility]. Pt has been given their phone number to call if needed. If she proceeds with CPAP she will let us know so we can schedule an initial follow-up appointment required by insurance.  ? ?Order, sleep study result, office note, insurance info all faxed to [facility]. Received a receipt of confirmation. ? ?

## 2022-03-30 NOTE — Telephone Encounter (Signed)
[facility] is processing the order.  ?

## 2022-03-30 NOTE — Progress Notes (Signed)
? ? ? ?Established Patient Office Visit ? ? ? ? ?CC/Reason for Visit: Discuss chronic concerns ? ?HPI: Jill Davis is a 56 y.o. female who is coming in today for the above mentioned reasons. Past Medical History is significant for: Obstructive sleep apnea who is not yet on CPAP therapy.  Depression, morbid obesity, multiple thyroid nodules with ultrasound in August 2023 that recommended FNA for 1 nodule that ended up being a benign follicular nodule/Bethesda 2 category and another few nodules for which a follow-up ultrasound in 1 year was recommended.  She has not yet been able to get her CPAP machine because she was quoted what sounds like a large deposit in a rental fee that she cannot afford.  Her weight has increased to 312 pounds.  Lack of CPAP seems to be causing a lot of issues with insomnia, fatigue and headaches. ? ? ?Past Medical/Surgical History: ?Past Medical History:  ?Diagnosis Date  ? Allergy   ? grass   ? Anemia   ? Anxiety   ? Arthritis   ? knees x 2   and hip on left   ? Asthma   ? none since moved to [place]   ? Depression   ? GERD (gastroesophageal reflux disease)   ? PRn Rolaids -  gets with red sauces etc   ? Heart murmur   ? as baby   ? History of substance abuse ([place])   ? Insomnia   ? Morbid obesity ([place])   ? Neuromuscular disorder ([place])   ? plantar fascitis   ? Substance abuse ([place])   ? Thyroid disease   ? currently no meds   ? ? ?Past Surgical History:  ?Procedure Laterality Date  ? ABDOMINAL HYSTERECTOMY    ? BREAST BIOPSY Left 11/21/2015  ? benign  ? COLONOSCOPY WITH PROPOFOL N/A 10/05/2020  ? Procedure: COLONOSCOPY WITH PROPOFOL;  Surgeon: Thornton Park, MD;  Location: [facility];  Service: Gastroenterology;  Laterality: N/A;  ? cyst on buttocks     ? PARTIAL HYSTERECTOMY    ? prior to abd full hysterectomy  ? POLYPECTOMY  10/05/2020  ? Procedure: POLYPECTOMY;  Surgeon: Thornton Park, MD;  Location: [facility];  Service: Gastroenterology;;  ? ? ?Social History: ? reports that  she has quit smoking. She has never used smokeless tobacco. She reports that she does not currently use alcohol. She reports current drug use. Drugs: Cocaine, Marijuana, "Crack" cocaine, and Heroin. ? ?Allergies: ?Allergies  ?Allergen Reactions  ? Celebrex [Celecoxib] Itching  ? ? ?Family History:  ?Family History  ?Problem Relation Age of Onset  ? Diabetes Mother   ? Hypertension Mother   ? CAD Father   ? Hypertension Father   ? Breast cancer Sister   ? Hypertension Brother   ? Colon cancer Neg Hx   ? Colon polyps Neg Hx   ? Esophageal cancer Neg Hx   ? Stomach cancer Neg Hx   ? Rectal cancer Neg Hx   ? ? ? ?Current Outpatient Medications:  ?  citalopram (CELEXA) 20 MG tablet, TAKE 1 TABLET BY MOUTH EVERY DAY, Disp: 90 tablet, Rfl: 1 ?  diphenhydramine-acetaminophen (TYLENOL PM) 25-500 MG TABS tablet, Take 2 tablets by mouth at bedtime as needed (sleep.)., Disp: , Rfl:  ?  ibuprofen (ADVIL) 200 MG tablet, Take 1,000 mg by mouth every 8 (eight) hours as needed (for pain.). , Disp: , Rfl:  ?  ofloxacin (OCUFLOX) 0.3 % ophthalmic solution, Place 1 drop into the left eye 4 (  four) times daily., Disp: 5 mL, Rfl: 0 ?  triamcinolone cream (KENALOG) 0.1 %, Apply 1 application topically 2 (two) times daily., Disp: 30 g, Rfl: 0 ?  zolpidem (AMBIEN) 10 MG tablet, TAKE 1 TABLET BY MOUTH EVERY DAY AT BEDTIME AS NEEDED FOR SLEEP, Disp: 15 tablet, Rfl: 1 ? ?Review of Systems:  ?Constitutional: Denies fever, chills, diaphoresis, appetite change. ?HEENT: Denies photophobia, eye pain, redness, hearing loss, ear pain, congestion, sore throat, rhinorrhea, sneezing, mouth sores, trouble swallowing, neck pain, neck stiffness and tinnitus.   ?Respiratory: Denies SOB, DOE, cough, chest tightness,  and wheezing.   ?Cardiovascular: Denies chest pain, palpitations and leg swelling.  ?Gastrointestinal: Denies nausea, vomiting, abdominal pain, diarrhea, constipation, blood in stool and abdominal distention.  ?Genitourinary: Denies dysuria,  urgency, frequency, hematuria, flank pain and difficulty urinating.  ?Endocrine: Denies: hot or cold intolerance, sweats, changes in hair or nails, polyuria, polydipsia. ?Musculoskeletal: Denies myalgias, back pain, joint swelling, arthralgias and gait problem.  ?Skin: Denies pallor, rash and wound.  ?Neurological: Denies dizziness, seizures, syncope, weakness, light-headedness, numbness. ?Hematological: Denies adenopathy. Easy bruising, personal or family bleeding history  ?Psychiatric/Behavioral: Denies suicidal ideation, mood changes, confusion, nervousness and agitation ? ? ? ?Physical Exam: ?Vitals:  ? 03/30/22 0708  ?BP: 110/74  ?Temp: (!) 97.5 ?F (36.4 ?C)  ?TempSrc: Oral  ?Weight: (!) 302 lb 8 oz (137.2 kg)  ? ? ?Body mass index is 51.12 kg/m?. ? ? ?Constitutional: NAD, calm, comfortable, morbid obesity ?Eyes: PERRL, lids and conjunctivae normal, wears corrective lenses ?ENMT: Mucous membranes are moist.  ?Respiratory: clear to auscultation bilaterally, no wheezing, no crackles. Normal respiratory effort. No accessory muscle use.  ?Cardiovascular: Regular rate and rhythm, no murmurs / rubs / gallops. No extremity edema. ?Neurologic: Grossly intact and nonfocal ?Psychiatric: Normal judgment and insight. Alert and oriented x 3. Normal mood.  ? ? ?Impression and Plan: ? ?Morbid obesity ([place]) ? - Plan: Amb Referral to Bariatric Surgery ?-Discussed healthy lifestyle, including increased physical activity and better food choices to promote weight loss.  We have spent a good amount of time discussing this today. ?-She will call her insurance company to see if any of the on market GLP-1's/GIP's would be covered.  If so I believe she would be a good candidate. ?-She will also be referred to bariatric surgery, she would be an excellent candidate for this. ? ? ?OSA (obstructive sleep apnea) ?-I will reach out to neurology to see if they can assist in any way and obtaining CPAP. ? ? ? ?Time spent:34 minutes reviewing  chart, interviewing and examining patient and formulating plan of care. ? ? ? ? ? ?[name], MD ?[facility] ? ? ?

## 2022-04-03 ENCOUNTER — Other Ambulatory Visit: Payer: Self-pay | Admitting: Internal Medicine

## 2022-04-03 MED ORDER — WEGOVY 0.25 MG/0.5ML ~~LOC~~ SOAJ: 0.2500 mg | SUBCUTANEOUS | 0 refills | Status: AC

## 2022-04-06 NOTE — Telephone Encounter (Signed)
Pt tried to call back a little bit ago but I was unable to speak with her. I called her back and LVM with office number for return call.

## 2022-04-10 ENCOUNTER — Encounter: Payer: Self-pay | Admitting: Internal Medicine

## 2022-04-18 NOTE — Telephone Encounter (Signed)
Spoke with Seth Bake (?) at [employer]. They will call patient today.

## 2022-04-26 ENCOUNTER — Telehealth: Payer: Self-pay | Admitting: Internal Medicine

## 2022-04-26 NOTE — Telephone Encounter (Signed)
Checking to see if PA for wegovy has been returned. Request loaded into chart on 04/10/22

## 2022-04-27 NOTE — Telephone Encounter (Signed)
Good morning, Dr Jerilee Hoh has sent in a prescription.  Unfortunately, your insurance will not cover it.  You can use the savings that you can find online.  Thanks

## 2022-04-27 NOTE — Addendum Note (Signed)
Addended by: Westley Hummer B on: 04/27/2022 11:49 AM   Modules accepted: Orders

## 2022-05-15 ENCOUNTER — Encounter: Payer: Self-pay | Admitting: Internal Medicine

## 2022-05-15 ENCOUNTER — Encounter: Payer: Self-pay | Admitting: Neurology

## 2022-06-06 ENCOUNTER — Other Ambulatory Visit: Payer: Self-pay | Admitting: Internal Medicine

## 2022-06-06 DIAGNOSIS — G4700 Insomnia, unspecified: Secondary | ICD-10-CM

## 2022-06-07 ENCOUNTER — Ambulatory Visit: Payer: 59 | Admitting: Internal Medicine

## 2022-06-21 ENCOUNTER — Other Ambulatory Visit: Payer: Self-pay | Admitting: Internal Medicine

## 2022-06-21 DIAGNOSIS — Z1231 Encounter for screening mammogram for malignant neoplasm of breast: Secondary | ICD-10-CM

## 2022-06-22 ENCOUNTER — Encounter: Payer: Self-pay | Admitting: *Deleted

## 2022-06-22 NOTE — Telephone Encounter (Signed)
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 Pt still needs initial cpap f/u scheduled. I called the pt and LVM asking for call back to get her scheduled. Left office number in message.

## 2022-06-26 ENCOUNTER — Encounter: Payer: Self-pay | Admitting: Neurology

## 2022-06-26 ENCOUNTER — Institutional Professional Consult (permissible substitution): Payer: 59 | Admitting: Neurology

## 2022-07-11 ENCOUNTER — Ambulatory Visit (INDEPENDENT_AMBULATORY_CARE_PROVIDER_SITE_OTHER): Payer: 59 | Admitting: Neurology

## 2022-07-11 ENCOUNTER — Other Ambulatory Visit: Payer: Self-pay | Admitting: Internal Medicine

## 2022-07-11 ENCOUNTER — Encounter: Payer: Self-pay | Admitting: Neurology

## 2022-07-11 VITALS — BP 109/65 | HR 64 | Ht 64.0 in | Wt 306.0 lb

## 2022-07-11 DIAGNOSIS — Z6841 Body Mass Index (BMI) 40.0 and over, adult: Secondary | ICD-10-CM

## 2022-07-11 DIAGNOSIS — G4733 Obstructive sleep apnea (adult) (pediatric): Secondary | ICD-10-CM

## 2022-07-11 DIAGNOSIS — F331 Major depressive disorder, recurrent, moderate: Secondary | ICD-10-CM

## 2022-07-11 DIAGNOSIS — H4710 Unspecified papilledema: Secondary | ICD-10-CM | POA: Diagnosis not present

## 2022-07-11 DIAGNOSIS — Z9989 Dependence on other enabling machines and devices: Secondary | ICD-10-CM | POA: Diagnosis not present

## 2022-07-11 NOTE — Patient Instructions (Signed)
Please continue using your autoPAP regularly. While your insurance requires that you use PAP at least 4 hours each night on 70% of the nights, I recommend, that you not skip any nights and use it throughout the night if you can. Getting used to PAP and staying with the treatment long term does take time and patience and discipline. Untreated obstructive sleep apnea when it is moderate to severe can have an adverse impact on cardiovascular health and raise her risk for heart disease, arrhythmias, hypertension, congestive heart failure, stroke and diabetes. Untreated obstructive sleep apnea causes sleep disruption, nonrestorative sleep, and sleep deprivation. This can have an impact on your day to day functioning and cause daytime sleepiness and impairment of cognitive function, memory loss, mood disturbance, and problems focussing. Using PAP regularly can improve these symptoms.   We can see you in 1 year for sleep apnea, you can see one of our nurse practitioners as you are stable.   Per Dr. 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  Please keep your follow-up appointment with him.  Please continue to work on weight loss.  We will request a LP (lumbar puncture/spinal tap) with pressure testing and routine fluid testing. We will call you with the results.  If your spinal fluid pressure is indeed elevated, we will have you start a medication called diamox to help keep your spinal fluid pressure at [place].  Some people need more than 1 spinal tap over time.  As you know, your vision and visual field can be affected. The most serious complication of having pseudotumor cerebri is loss of vision, which can be permanent. Work up, at least initially with the eye specialist  should include: best corrected visual acuity, formal visual field testing, dilated fundus examination with optic disc photographs, and often optical coherence tomography (OCT) of the optic nerve, retinal nerve fiber layer, and macular ganglion cell layer. Worsening vision is an indication for intensifying treatment.   Ultimately, you may need to be considered for a shunt in the future (to prevent fluid pressure from building up over and over).

## 2022-07-11 NOTE — Progress Notes (Signed)
Subjective:    Patient ID: Jill Davis is a 56 y.o. female.  HPI    Interim history:   [name] is a 56 year old right-handed woman with an underlying medical history of allergies, arthritis, asthma, anemia, anxiety, depression, reflux disease, vitamin D deficiency, and morbid obesity with a BMI of over 107, who presents for follow-up consultation of her obstructive sleep apnea after home sleep testing and starting AutoPap therapy.  The patient is unaccompanied today. I first met her at the request of her primary care physician on 04/19/2021, at which time she reported snoring and excessive daytime somnolence, as well as difficulty initiating and maintaining sleep.  She was advised to proceed with sleep study.  She had a home sleep test on 05/16/2021 which indicated moderate obstructive sleep apnea with a total AHI of 29.2/hour and O2 nadir of 84%. Snoring ranged from mild to loud.  She was advised to proceed with AutoPap therapy.  Her set up date was 05/12/22.  She has a ResMed air sense 11 AutoSet machine.  Today, 07/11/2022: I reviewed her AutoPap compliance data from 06/10/2022 through 07/09/2022, which is a total of 30 days, during which time she used her machine 27 days with percent use days greater than 4 hours at 80%, indicating very good compliance with an average usage of 7 hours and 12 minutes, residual AHI at goal at 0.9/h, average pressure for the 95th percentile at 8.2 cm with a range of 6 to 12 cm with EPR of 3.  Leak on the higher side with the 95th percentile at 25.5 L/min.  She reports still adjusting to treatment, she is a restless sleeper, she tried the nasal cushion interface but it was uncomfortable and now she is on nasal pillows.  She tried a chinstrap but it was very uncomfortable and caused her to have headaches.  She actually has discontinued using a chinstrap and interestingly her leak is actually lower now.  She has had improvement in her nocturia from 2-3 times per night down to  once per night.  Her Epworth sleepiness score is 8 out of 24 today.  She is trying to sleep on her sides.  She feels slightly better with regards to her daytime energy and sleepiness.  She is motivated to continue treatment with AutoPap therapy.  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 She was referred by Dr. Jule Ser, retina specialist at the time.  When I explained to her that we do need to make another appointment to talk about this condition and the possibility of underlying pseudotumor cerebri, the patient reported that she did not want this evaluated and that "I don't care anymore". She is worried about additional cost for every specialty visit.  She is worried that her eye doctor would be upset with her as she has an appointment coming up this week and she has not pursued or does not wish to pursue evaluation and treatment for optic disc edema.  She was advised strongly to make another appointment with me so we can discuss in much more detail her optic disc edema.  During the visit, it was clear that she had major concerns about financial constraints.  I offered her a brief explanation of the condition called pseudotumor cerebri and the prognosis and treatment options as well as diagnostic test options for pseudotumor cerebri.  She was aware that a lumbar puncture is typically recommended.  She is reluctant to proceed but did eventually agreed to  a referral to radiology for spinal tap under fluoroscopic guidance.  I explained this procedure to her.  She is apprehensive about this and her follow-up with her retina specialist.  I spent extra time today explaining that it could eventually affect her vision and also in the worst case scenario lead to irreversible vision loss, even blindness.  I reviewed the office note from 04/03/2022, when she saw Dr. Jule Ser.  She reported decreased vision over the past several months.  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 She had an interim brain MRI with and without contrast.  She had a brain and orbital MRI with and without contrast through [facility] on 04/19/2022 and I reviewed the results: IMPRESSION:   1.  Normal MRI of the orbits, specifically no evidence of optic nerve papilledema..   2.  Single foci of FLAIR signal in the right cerebral subcortical white matter nonspecific, but commonly seen with small vessel ischemic change.  I had evaluated her for sleep apnea concern a little over a year ago.  She has been working on weight loss.  She reports that she would not be able to pursue bariatric surgery because her insurance would not cover it.  The patient's allergies, current medications, family history, past medical history, past social history, past surgical history and problem list were reviewed and updated as appropriate.   Previously:   04/19/21: (She) reports difficulty initiating and maintaining sleep as well as snoring and daytime somnolence.  I reviewed your office note from 02/03/2021.  Her Epworth sleepiness score is 9 out of 24, fatigue severity score is 61 out of 63.  Her husband reports that her snoring can be loud.  She has a family history of sleep apnea affecting her mom and her sister, both have CPAP machines.  The patient has never had a sleep study.  She has tried Ambien in the past and was on it for some years, then was off for several years and recently restarted it.  She has a bedtime of around 10 PM and rise time of around 7 or 7:30 AM.  She drinks caffeine in the form of soda, about 3 servings per average day, no alcohol and no drugs for over 30 years.  She quit smoking about a year and a half ago.  She lives with her husband, they have 2 cats in the household, she does sleep with the TV on at night in her bedroom.  She has a history of bruxism and has tried an over-the-counter bite guard.  She has tried over-the-counter medication for sleep including ZzzQuil and  melatonin.  She does nap during the day when she can, not daily.  She works mostly from home.  She has gained maybe 25 to 30 pounds in the past 2 years.  She had a tonsillectomy as a child.  She has had occasional morning headaches and has nocturia about once per average night.  Her Past Medical History Is Significant For: Past Medical History:  Diagnosis Date   Allergy    grass    Anemia    Anxiety    Arthritis    knees x 2   and hip on left    Asthma    none since moved to [place]    Depression    GERD (gastroesophageal reflux disease)    PRn Rolaids -  gets with red sauces etc    Heart murmur    as baby  History of substance abuse ([place])    Insomnia    Morbid obesity ([place])    Neuromuscular disorder ([place])    plantar fascitis    Substance abuse ([place])    Thyroid disease    currently no meds     Her Past Surgical History Is Significant For: Past Surgical History:  Procedure Laterality Date   ABDOMINAL HYSTERECTOMY     BREAST BIOPSY Left 11/21/2015   benign   COLONOSCOPY WITH PROPOFOL N/A 10/05/2020   Procedure: COLONOSCOPY WITH PROPOFOL;  Surgeon: Thornton Park, MD;  Location: [facility];  Service: Gastroenterology;  Laterality: N/A;   cyst on buttocks      PARTIAL HYSTERECTOMY     prior to abd full hysterectomy   POLYPECTOMY  10/05/2020   Procedure: POLYPECTOMY;  Surgeon: Thornton Park, MD;  Location: [facility];  Service: Gastroenterology;;    Her Family History Is Significant For: Family History  Problem Relation Age of Onset   Diabetes Mother    Hypertension Mother    Sleep apnea Mother    CAD Father    Hypertension Father    Sleep apnea Father    Breast cancer Sister    Sleep apnea Sister    Hypertension Brother    Colon cancer Neg Hx    Colon polyps Neg Hx    Esophageal cancer Neg Hx    Stomach cancer Neg Hx    Rectal cancer Neg Hx     Her Social History Is Significant For: Social History   Socioeconomic History   Marital status: Married     Spouse name: Not on file   Number of children: Not on file   Years of education: Not on file   Highest education level: Not on file  Occupational History   Not on file  Tobacco Use   Smoking status: Former   Smokeless tobacco: Never  Substance and Sexual Activity   Alcohol use: Not Currently   Drug use: Yes    Types: Cocaine, Marijuana, "Crack" cocaine, Heroin    Comment: quit 1990   Sexual activity: Not on file  Other Topics Concern   Not on file  Social History Narrative   Not on file   Social Determinants of Health   Financial Resource Strain: Not on file  Food Insecurity: Not on file  Transportation Needs: Not on file  Physical Activity: Not on file  Stress: Not on file  Social Connections: Not on file    Her Allergies Are:  Allergies  Allergen Reactions   Celebrex [Celecoxib] Itching  :   Her Current Medications Are:  Outpatient Encounter Medications as of 07/11/2022  Medication Sig   citalopram (CELEXA) 20 MG tablet TAKE 1 TABLET BY MOUTH EVERY DAY   diphenhydramine-acetaminophen (TYLENOL PM) 25-500 MG TABS tablet Take 2 tablets by mouth at bedtime as needed (sleep.).   ibuprofen (ADVIL) 200 MG tablet Take 1,000 mg by mouth every 8 (eight) hours as needed (for pain.).    ofloxacin (OCUFLOX) 0.3 % ophthalmic solution Place 1 drop into the left eye 4 (four) times daily.   triamcinolone cream (KENALOG) 0.1 % Apply 1 application topically 2 (two) times daily.   zolpidem (AMBIEN) 10 MG tablet TAKE 1 TABLET BY MOUTH AT BEDTIME AS NEEDED FOR SLEEP   zolpidem (AMBIEN) 5 MG tablet Take 5 mg by mouth at bedtime.   Calcium Carb-Cholecalciferol (CALCIUM 600/VITAMIN D PO) Take by mouth.   No facility-administered encounter medications on file as of 07/11/2022.  :  Review  of Systems:  Out of a complete 14 point review of systems, all are reviewed and negative with the exception of these symptoms as listed below:  Review of Systems  Neurological:        Pt here for  CPAP f/u Pt states no questions or concerns for this visit   ESS:    Objective:  Neurological Exam  Physical Exam Physical Examination:   Vitals:   07/11/22 0728  BP: 109/65  Pulse: 64    General Examination: The patient is a very pleasant 56 y.o. female in no acute distress. She appears well-developed and well-nourished and well groomed.   HEENT: Normocephalic, atraumatic, pupils are equal, round and reactive to light, extraocular tracking is good without limitation to gaze excursion or nystagmus noted.  Funduscopic exam reveals mild optic disc blurry rim on the right, possibly also slightly on the left.  Visual field testing with gross finger counting is slightly impaired on the right with lower outer quadrant disparity.  Face is symmetric with normal facial animation. Speech is clear with no dysarthria noted. There is no hypophonia. There is no lip, neck/head, jaw or voice tremor. Neck is supple with full range of passive and active motion. There are no carotid bruits on auscultation. Oropharynx exam reveals: mild mouth dryness, adequate dental hygiene and moderate airway crowding. Tongue protrudes centrally and palate elevates symmetrically.   Chest: Clear to auscultation without wheezing, rhonchi or crackles noted.   Heart: S1+S2+0, regular and normal without murmurs, rubs or gallops noted.    Abdomen: Soft, non-tender and non-distended.   Extremities: There is no pitting edema in the distal lower extremities bilaterally.    Skin: Warm and dry without trophic changes noted.    Musculoskeletal: exam reveals no obvious joint deformities.    Neurologically:  Mental status: The patient is awake, alert and oriented in all 4 spheres. Her immediate and remote memory, attention, language skills and fund of knowledge are appropriate. There is no evidence of aphasia, agnosia, apraxia or anomia. Speech is clear with normal prosody and enunciation. Thought process is linear. Mood is normal  and affect is normal.  Cranial nerves II - XII are as described above under HEENT exam.  Motor exam: Normal bulk, strength and tone is noted. There is no obvious tremor, Romberg is negative. Fine motor skills and coordination: grossly intact.  Cerebellar testing: No dysmetria or intention tremor. There is no truncal or gait ataxia.  Sensory exam: intact to light touch in the upper and lower extremities.  Gait, station and balance: She stands easily. No veering to one side is noted. No leaning to one side is noted. Posture is age-appropriate and stance is narrow based. Gait shows normal  stride length and normal pace. No problems turning are noted.    Assessment and Plan:    In summary, Lucky Jill Davis is a very pleasant 56 year old female with an underlying medical history of allergies, arthritis, asthma, anemia, anxiety, depression, reflux disease, vitamin D deficiency, and morbid obesity with a BMI of over 54, who presents for follow-up consultation of her obstructive sleep apnea and also to discuss optic disc swelling.  She was not able to keep a separate appointment for more in-depth evaluation for her optic disc swelling and discussion of pseudotumor cerebri but we did have an extended visit today to cover both conditions.  She has established treatment with AutoPap therapy and is compliant with treatment but is still adjusting to it.  She is commended for her  treatment adherence.  Her home sleep test from 05/16/2021 indicated moderate/near severe obstructive sleep apnea with a total AHI of 29.2/hour and O2 nadir of 84%.  She has advised to continue with treatment at the current settings.  She is furthermore advised to proceed with a lumbar puncture for spinal fluid testing, particularly opening and closing pressures.  I explained this to her in detail today.  She initially declined evaluation and treatment for pseudotumor cerebri and reported that she does not have the money to continue to pay for  specialty co-pays.  Eventually, she agreed for [place] to forego a separate evaluation appointment and to go ahead with a referral to radiology for lumbar puncture.  She also is strongly encouraged to keep her follow-up appointment with her retina specialist this week which she originally wanted to cancel as well or at least delay.  She is advised that it is important to keep regular appointments with her eye specialist to make sure her vision does not get worse and that the optic disc swelling does not get worse.  She was advised to follow-up in 1 year to see one of our nurse practitioners for sleep apnea follow-up and we will call her with the lumbar puncture results and consider medication like Diamox if need be.  I answered all her questions today and she was in agreement with our plan.  I spent 45 minutes in total face-to-face time and in reviewing records during pre-charting, more than 50% of which was spent in counseling and coordination of care, reviewing test results, reviewing medications and treatment regimen and/or in discussing or reviewing the diagnosis of OSA, optic nerve edema, the prognosis and treatment options. Pertinent laboratory and imaging test results that were available during this visit with the patient were reviewed by me and considered in my medical decision making (see chart for details).

## 2022-07-12 ENCOUNTER — Other Ambulatory Visit: Payer: Self-pay

## 2022-07-31 ENCOUNTER — Ambulatory Visit
Admission: RE | Admit: 2022-07-31 | Discharge: 2022-07-31 | Disposition: A | Discharge status: Home / Self Care | Payer: 59 | Source: Ambulatory Visit | Attending: Neurology | Admitting: Neurology

## 2022-07-31 ENCOUNTER — Telehealth: Payer: Self-pay | Admitting: *Deleted

## 2022-07-31 VITALS — BP 133/87 | HR 61

## 2022-07-31 DIAGNOSIS — H4710 Unspecified papilledema: Secondary | ICD-10-CM

## 2022-07-31 DIAGNOSIS — G4733 Obstructive sleep apnea (adult) (pediatric): Secondary | ICD-10-CM

## 2022-07-31 LAB — GLUCOSE, CSF: Glucose, CSF: 75 mg/dL (ref 40–80)

## 2022-07-31 LAB — GRAM STAIN
MICRO NUMBER:: 13898660
SPECIMEN QUALITY:: ADEQUATE

## 2022-07-31 LAB — PROTEIN, CSF: Total Protein, CSF: 32 mg/dL (ref 15–45)

## 2022-07-31 LAB — CSF CELL COUNT WITH DIFFERENTIAL
RBC Count, CSF: 5 cells/uL — ABNORMAL HIGH
TOTAL NUCLEATED CELL: 1 cells/uL (ref 0–5)

## 2022-07-31 MED ORDER — ACETAZOLAMIDE ER 500 MG PO CP12: 500.0000 mg | ORAL_CAPSULE | Freq: Two times a day (BID) | ORAL | 3 refills | Status: DC

## 2022-07-31 NOTE — Discharge Instructions (Signed)

## 2022-07-31 NOTE — Telephone Encounter (Signed)
Please call patient and advise her that her spinal fluid test results or so far benign, but she did have an elevated fluid pressure at 22 cm, normal is less than 18-20 cm.  As discussed, I would like for her to start a new medication to help keep the spinal fluid pressure at [place].  I will send in a prescription for Diamox (generic name: acetazolamide) 500 mg strength: Take one pill each night at bedtime for 1 week, then 1 pill twice daily thereafter. Common side effects reported are: Dizziness, lightheadedness, increase in urine output, blurry vision, dry mouth, drowsiness, loss of appetite, upset stomach, headache and tiredness, tingling in the hands and feet and change in taste, especially with carbonated sodas. Most side effects are transient especially during the first few days as the body adjusts to the medication.    The prescription was flagged because she had a reaction to Celebrex, it said she had itching with the Celebrex.  Please inquire as to the extent of her reaction to Celebrex as the Diamox prescription was flagged as a possible cross reaction.  I would like to have her discuss this with her pharmacist as well as to the extent of concern of cross reaction.  I have still signed off on the prescription for now.  Please also arrange for follow-up in 3 months to see the nurse practitioner.  Please remind her to keep a regular follow-up with her ophthalmologist.

## 2022-07-31 NOTE — Telephone Encounter (Signed)
Noted, thank you

## 2022-07-31 NOTE — Telephone Encounter (Signed)
I called pt and gave her the results of the LP results.  She will call pharmacy and check on the cross reaction.  She had itching when taking the celebrex.  ( All over her body while taking celebrex).  Has opthamology appt on Monday.  ( Dr. Jule Ser).  I sent her message about side effects, in mychart with appt time and date, and she is to call back if any questions.

## 2022-07-31 NOTE — Telephone Encounter (Signed)
Nat[facility] from [facility] called this am for urgent results . Sent to Dr Rexene Alberts high priority

## 2022-08-07 ENCOUNTER — Ambulatory Visit
Admission: RE | Admit: 2022-08-07 | Discharge: 2022-08-07 | Disposition: A | Discharge status: Home / Self Care | Payer: 59 | Source: Ambulatory Visit | Attending: Internal Medicine | Admitting: Internal Medicine

## 2022-08-07 DIAGNOSIS — Z1231 Encounter for screening mammogram for malignant neoplasm of breast: Secondary | ICD-10-CM

## 2022-08-13 ENCOUNTER — Encounter: Payer: Self-pay | Admitting: Internal Medicine

## 2022-08-14 ENCOUNTER — Other Ambulatory Visit: Payer: Self-pay | Admitting: Internal Medicine

## 2022-08-14 DIAGNOSIS — F331 Major depressive disorder, recurrent, moderate: Secondary | ICD-10-CM

## 2022-08-14 MED ORDER — BUPROPION HCL ER (XL) 150 MG PO TB24: 150.0000 mg | ORAL_TABLET | Freq: Every day | ORAL | 1 refills | Status: DC

## 2022-08-14 NOTE — Telephone Encounter (Signed)
She can take both Diamox pills at night if she has had daytime side effects from the daytime dose.

## 2022-08-16 ENCOUNTER — Telehealth: Payer: Self-pay | Admitting: Neurology

## 2022-08-16 NOTE — Telephone Encounter (Signed)
I received an office visit note from Dr. Jule Ser from [facility] retina specialists.  Patient had a visit on 08/07/2022.  Right optic disc edema was improving.  Patient was reporting floaters.  Visual acuity was preserved.  She was advised to see Dr. [place] and return to his clinic in 3 to 4 months.  Please send my visit note to Dr. Jule Ser.   Nothing further needed.

## 2022-08-17 NOTE — Telephone Encounter (Signed)
Medical records dept sent Dr Guadelupe Sabin note to Dr Jule Ser.

## 2022-08-21 ENCOUNTER — Ambulatory Visit: Payer: 59 | Admitting: Neurology

## 2022-09-08 IMAGING — MG MM DIGITAL SCREENING BILAT W/ TOMO AND CAD
8 of 14 series · 8 of 38 positions shown · non-contrast
Comparison: Previous exam(s).

ACR Breast Density Category a: The breast tissue is almost entirely
fatty.

CLINICAL DATA: Screening.

EXAM:
DIGITAL SCREENING BILATERAL MAMMOGRAM WITH TOMOSYNTHESIS AND CAD
TECHNIQUE: Bilateral screening digital craniocaudal and mediolateral oblique
mammograms were obtained. Bilateral screening digital breast
tomosynthesis was performed. The images were evaluated with
computer-aided detection.

[L MLO synth-2D (1 of 2)]
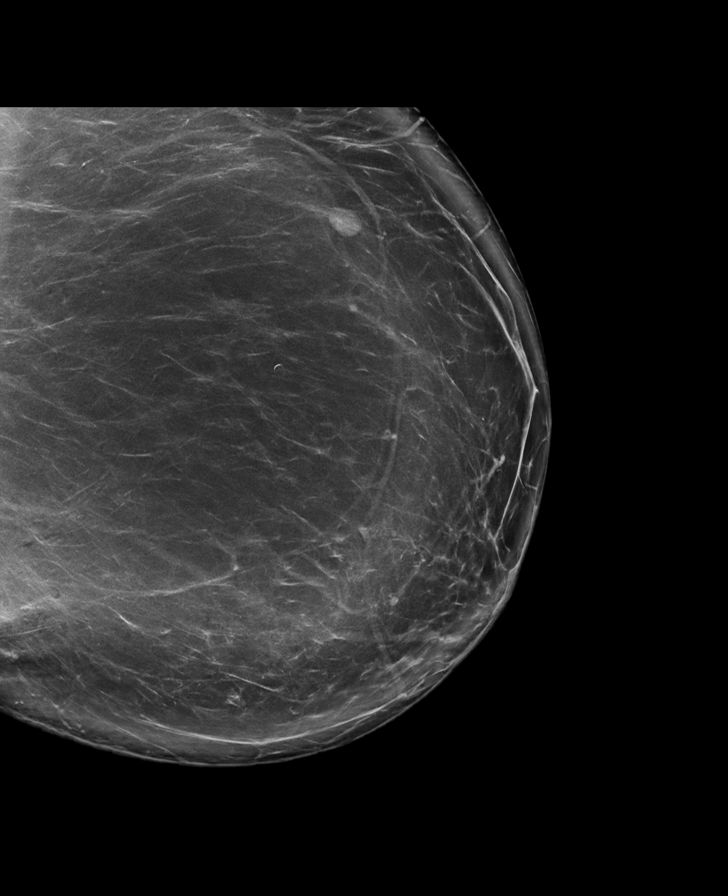

[L CC synth-2D (1 of 2)]
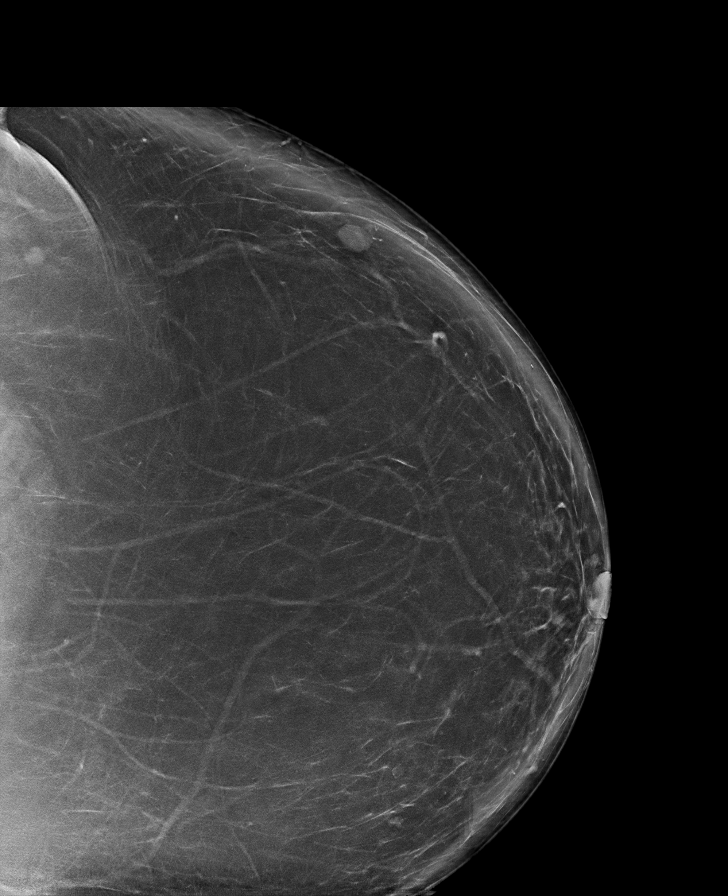

[R MLO synth-2D (1 of 2)]
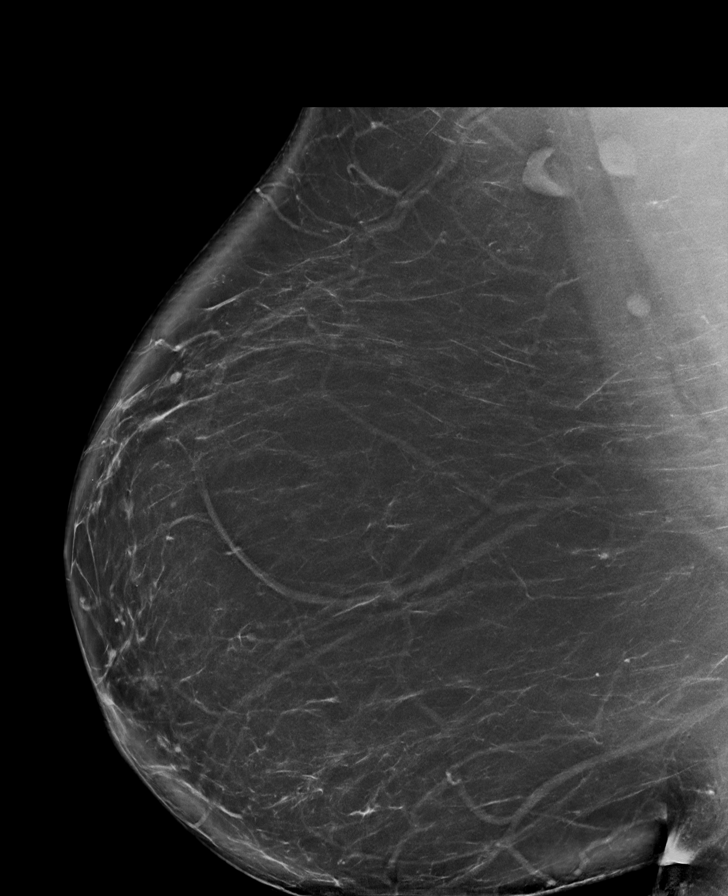

[R CC synth-2D (1 of 2)]
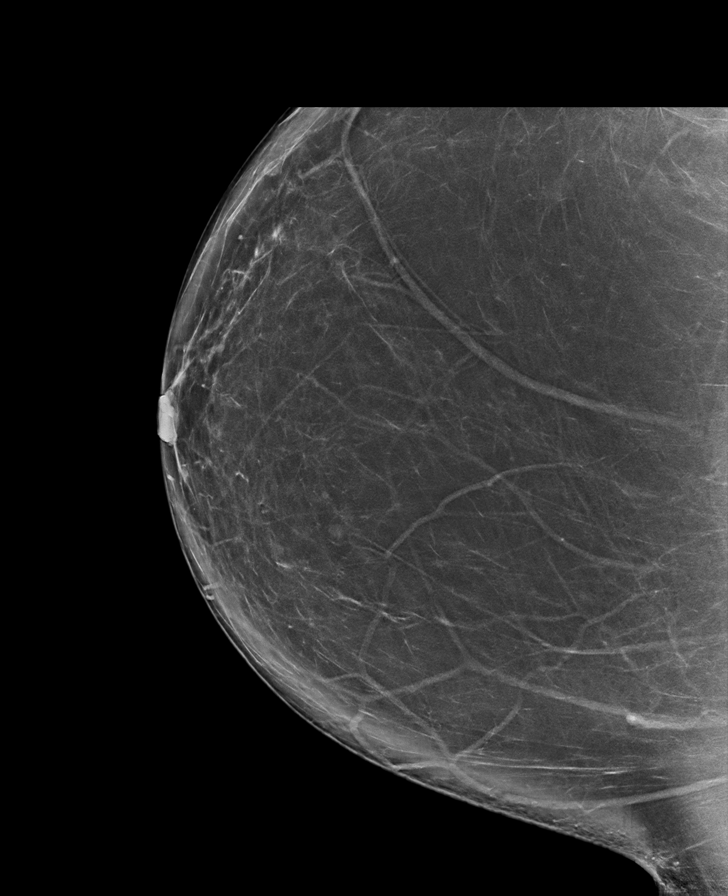

[L MLO synth-2D (2 of 2)]
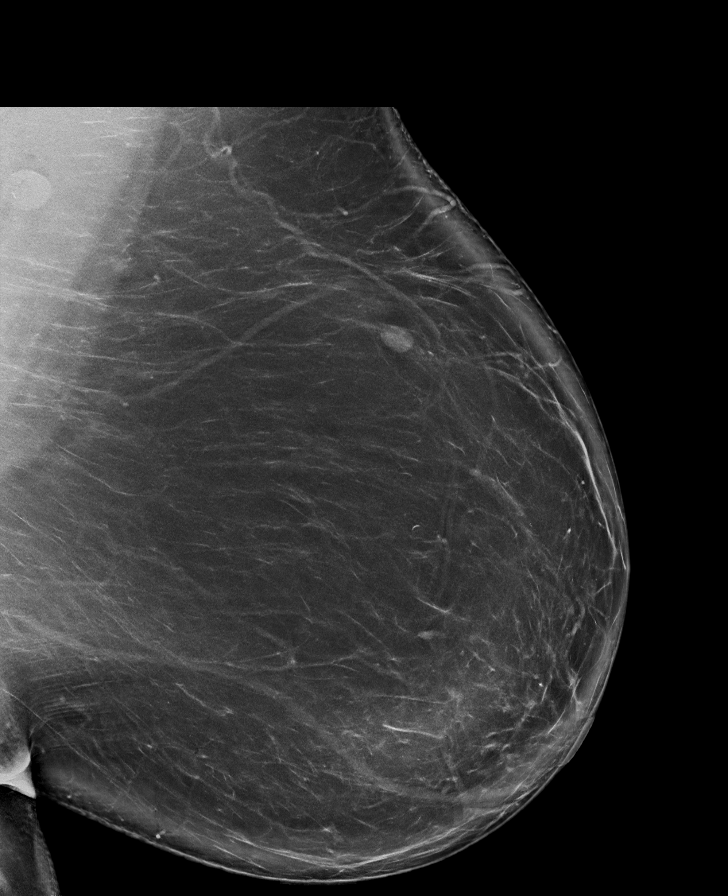

[L CC synth-2D (2 of 2)]
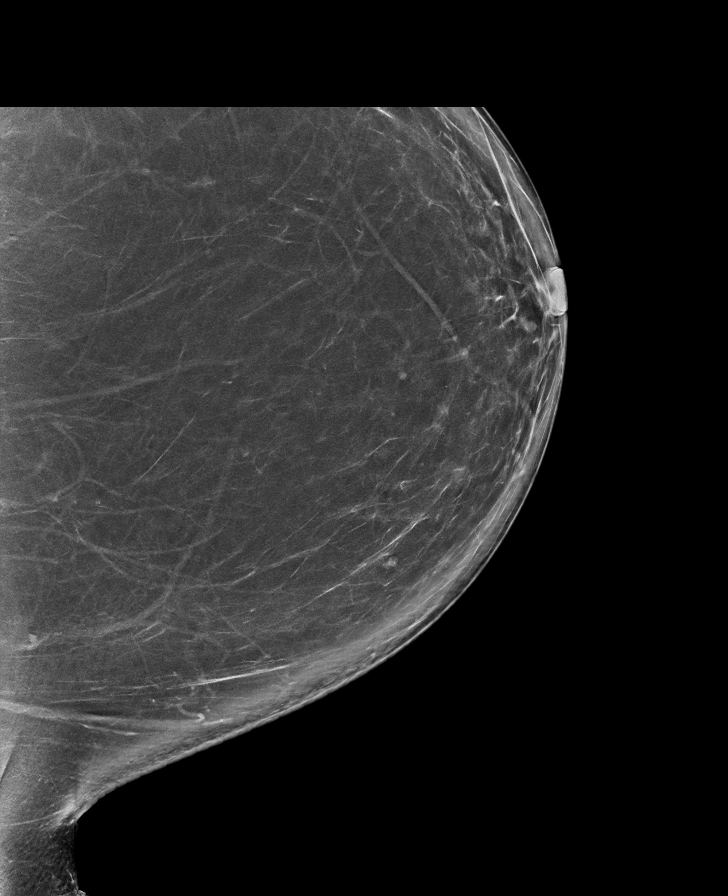

[R MLO synth-2D (2 of 2)]
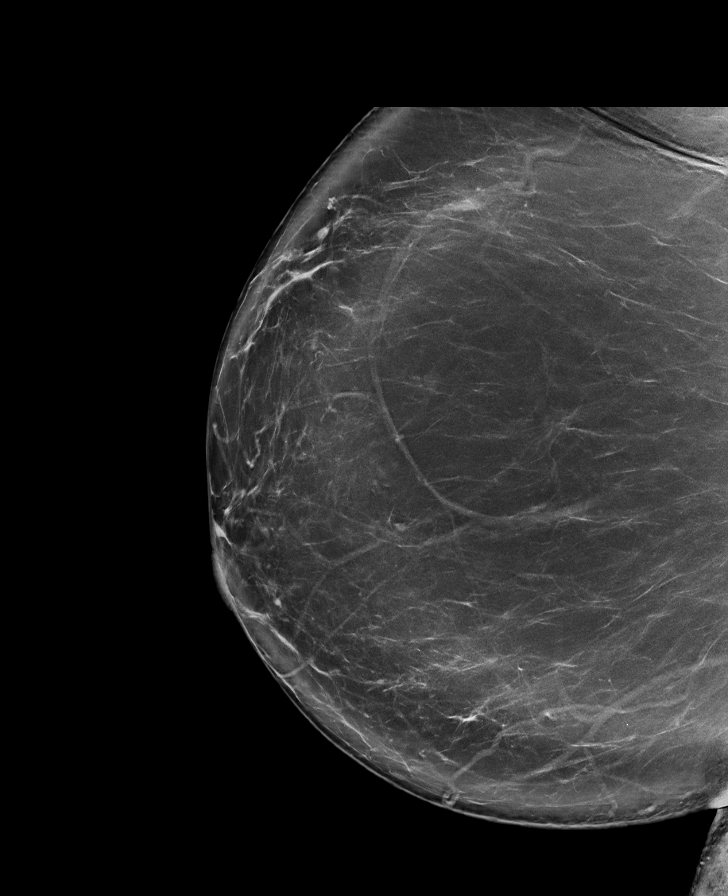

[R CC synth-2D (2 of 2)]
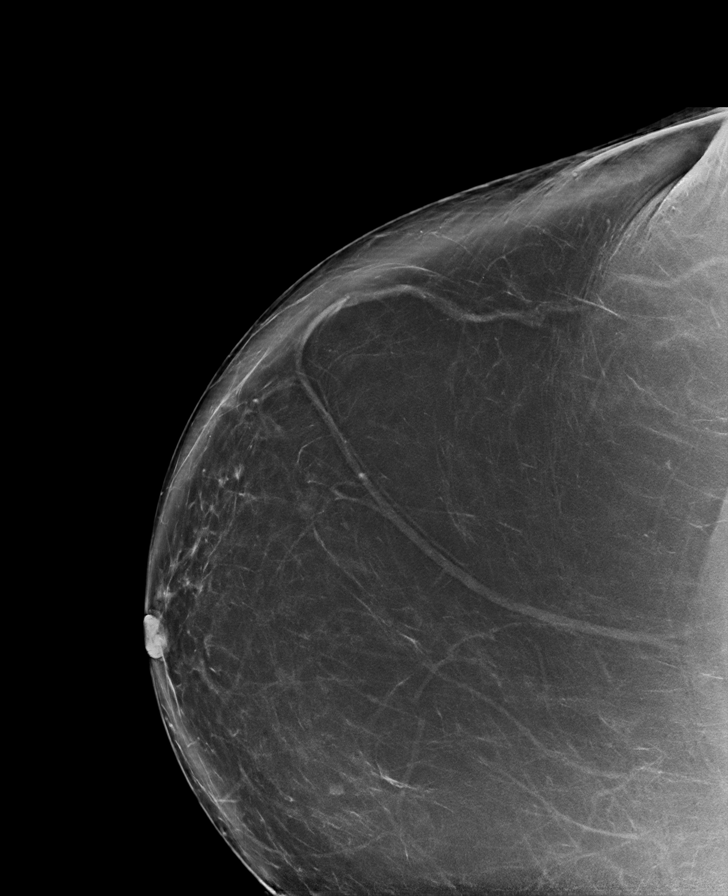

[8 of 38 positions shown; findings below may reference images not displayed]

FINDINGS: There are no findings suspicious for malignancy.
IMPRESSION: No mammographic evidence of malignancy. A result letter of this
screening mammogram will be mailed directly to the patient.

RECOMMENDATION:
Screening mammogram in one year. (Code:0E-3-N98)

BI-RADS CATEGORY  1: Negative.

## 2022-09-20 ENCOUNTER — Encounter: Payer: Self-pay | Admitting: Neurology

## 2022-09-20 MED ORDER — ACETAZOLAMIDE 250 MG PO TABS: ORAL_TABLET | ORAL | 6 refills | Status: DC

## 2022-09-20 NOTE — Addendum Note (Signed)
Addended by: Genia Harold on: 09/20/2022 11:47 AM   Modules accepted: Orders

## 2022-09-20 NOTE — Telephone Encounter (Signed)
She could try going back to taking it twice a day but decreasing the AM dose. That way she would be taking 250 mg in the morning and 500 mg at bedtime. I sent a new prescription to her pharmacy

## 2022-09-21 MED ORDER — TOPIRAMATE 50 MG PO TABS: ORAL_TABLET | ORAL | 3 refills | Status: DC

## 2022-09-21 NOTE — Addendum Note (Signed)
Addended by: Genia Harold on: 09/21/2022 02:50 PM   Modules accepted: Orders

## 2022-09-21 NOTE — Telephone Encounter (Signed)
If she doesn't want to take Diamox we can try switching to Topamax and see if she tolerates that better. I sent a prescription for Topamax to her pharmacy

## 2022-09-28 ENCOUNTER — Ambulatory Visit: Payer: 59 | Admitting: Internal Medicine

## 2022-09-28 ENCOUNTER — Encounter: Payer: Self-pay | Admitting: Internal Medicine

## 2022-09-28 DIAGNOSIS — Z114 Encounter for screening for human immunodeficiency virus [HIV]: Secondary | ICD-10-CM

## 2022-09-28 DIAGNOSIS — G932 Benign intracranial hypertension: Secondary | ICD-10-CM | POA: Diagnosis not present

## 2022-09-28 DIAGNOSIS — G4733 Obstructive sleep apnea (adult) (pediatric): Secondary | ICD-10-CM | POA: Diagnosis not present

## 2022-09-28 DIAGNOSIS — Z23 Encounter for immunization: Secondary | ICD-10-CM | POA: Diagnosis not present

## 2022-09-28 NOTE — Progress Notes (Signed)
Established Patient Office Visit     CC/Reason for Visit: Follow-up chronic conditions  HPI: Jill Davis is a 56 y.o. female who is coming in today for the above mentioned reasons. Past Medical History is significant for: Morbid obesity, obstructive sleep apnea on CPAP, depression.  It appears she was recently diagnosed with optic disc edema and concern for pseudotumor cerebri.  She was sent for spinal tap where she was found to have a high opening pressure.  She was placed on Diamox but did not do well with this medication and has recently been transitioned over to Topamax.  She is following with neurology.  She just wanted to keep me updated on her situation.  She is wondering about the referral to bariatrics that had been placed a while back.  It seems she has not been called to schedule appointment as of yet.   Past Medical/Surgical History: Past Medical History:  Diagnosis Date   Allergy    grass    Anemia    Anxiety    Arthritis    knees x 2   and hip on left    Asthma    none since moved to [place]    Depression    GERD (gastroesophageal reflux disease)    PRn Rolaids -  gets with red sauces etc    Heart murmur    as baby    History of substance abuse ([place])    Insomnia    Morbid obesity ([place])    Neuromuscular disorder ([place])    plantar fascitis    Substance abuse ([place])    Thyroid disease    currently no meds     Past Surgical History:  Procedure Laterality Date   ABDOMINAL HYSTERECTOMY     BREAST BIOPSY Left 11/21/2015   benign   COLONOSCOPY WITH PROPOFOL N/A 10/05/2020   Procedure: COLONOSCOPY WITH PROPOFOL;  Surgeon: Thornton Park, MD;  Location: [facility];  Service: Gastroenterology;  Laterality: N/A;   cyst on buttocks      PARTIAL HYSTERECTOMY     prior to abd full hysterectomy   POLYPECTOMY  10/05/2020   Procedure: POLYPECTOMY;  Surgeon: Thornton Park, MD;  Location: [facility];  Service: Gastroenterology;;    Social History:   reports that she has quit smoking. She has never used smokeless tobacco. She reports that she does not currently use alcohol. She reports current drug use. Drugs: Cocaine, Marijuana, "Crack" cocaine, and Heroin.  Allergies: Allergies  Allergen Reactions   Celebrex [Celecoxib] Itching    Family History:  Family History  Problem Relation Age of Onset   Diabetes Mother    Hypertension Mother    Sleep apnea Mother    CAD Father    Hypertension Father    Sleep apnea Father    Breast cancer Sister        72s   Sleep apnea Sister    Hypertension Brother    Colon cancer Neg Hx    Colon polyps Neg Hx    Esophageal cancer Neg Hx    Stomach cancer Neg Hx    Rectal cancer Neg Hx      Current Outpatient Medications:    buPROPion (WELLBUTRIN XL) 150 MG 24 hr tablet, Take 1 tablet (150 mg total) by mouth daily., Disp: 90 tablet, Rfl: 1   Calcium Carb-Cholecalciferol (CALCIUM 600/VITAMIN D PO), Take by mouth., Disp: , Rfl:    diphenhydramine-acetaminophen (TYLENOL PM) 25-500 MG TABS tablet, Take 2 tablets by mouth at bedtime  as needed (sleep.)., Disp: , Rfl:    ibuprofen (ADVIL) 200 MG tablet, Take 1,000 mg by mouth every 8 (eight) hours as needed (for pain.). , Disp: , Rfl:    ofloxacin (OCUFLOX) 0.3 % ophthalmic solution, Place 1 drop into the left eye 4 (four) times daily., Disp: 5 mL, Rfl: 0   topiramate (TOPAMAX) 50 MG tablet, Take 50 mg at bedtime for one week, then increase to 50 mg twice a day, Disp: 60 tablet, Rfl: 3   triamcinolone cream (KENALOG) 0.1 %, Apply 1 application topically 2 (two) times daily., Disp: 30 g, Rfl: 0   zolpidem (AMBIEN) 10 MG tablet, TAKE 1 TABLET BY MOUTH AT BEDTIME AS NEEDED FOR SLEEP, Disp: 15 tablet, Rfl: 1   zolpidem (AMBIEN) 5 MG tablet, Take 5 mg by mouth at bedtime., Disp: , Rfl:   Review of Systems:  Constitutional: Denies fever, chills, diaphoresis, appetite change.  HEENT: Denies photophobia, eye pain, redness, hearing loss, ear pain, congestion,  sore throat, rhinorrhea, sneezing, mouth sores, trouble swallowing, neck pain, neck stiffness and tinnitus.   Respiratory: Denies SOB, DOE, cough, chest tightness,  and wheezing.   Cardiovascular: Denies chest pain, palpitations and leg swelling.  Gastrointestinal: Denies nausea, vomiting, abdominal pain, diarrhea, constipation, blood in stool and abdominal distention.  Genitourinary: Denies dysuria, urgency, frequency, hematuria, flank pain and difficulty urinating.  Endocrine: Denies: hot or cold intolerance, sweats, changes in hair or nails, polyuria, polydipsia. Musculoskeletal: Denies myalgias, back pain, joint swelling, arthralgias and gait problem.  Skin: Denies pallor, rash and wound.  Neurological: Denies dizziness, seizures, syncope, weakness, light-headedness, numbness and headaches.  Hematological: Denies adenopathy. Easy bruising, personal or family bleeding history  Psychiatric/Behavioral: Denies suicidal ideation, mood changes, confusion, nervousness, sleep disturbance and agitation    Physical Exam: Vitals:   09/28/22 0748  BP: 124/84  Temp: (!) 97.5 F (36.4 C)  TempSrc: Oral  Weight: (!) 301 lb (136.5 kg)    Body mass index is 51.67 kg/m.   Constitutional: NAD, calm, comfortable Eyes: PERRL, lids and conjunctivae normal ENMT: Mucous membranes are moist.  Respiratory: clear to auscultation bilaterally, no wheezing, no crackles. Normal respiratory effort. No accessory muscle use.  Cardiovascular: Regular rate and rhythm, no murmurs / rubs / gallops. No extremity edema. Psychiatric: Normal judgment and insight. Alert and oriented x 3. Normal mood.    Impression and Plan:  Morbid obesity (HCC)  OSA (obstructive sleep apnea)  Pseudotumor cerebri  Need for influenza vaccination - Plan: Flu Vaccine QUAD 6+ mos PF IM (Fluarix Quad PF)  Encounter for screening for HIV - Plan: HIV antibody (with reflex)  -Vaccine administered today. -HIV screen done per  request. -Will follow-up on referral to bariatric program. -She is currently prescribed Topamax as she was unable to tolerate Diamox, she will continue to follow-up with neurology for this.  Time spent:32 minutes reviewing chart, interviewing and examining patient and formulating plan of care.      [name], MD [facility]

## 2022-09-29 LAB — HIV ANTIBODY (ROUTINE TESTING W REFLEX): HIV 1&2 Ab, 4th Generation: NONREACTIVE

## 2022-09-30 ENCOUNTER — Encounter: Payer: Self-pay | Admitting: Internal Medicine

## 2022-10-01 ENCOUNTER — Other Ambulatory Visit: Payer: Self-pay | Admitting: Internal Medicine

## 2022-10-01 DIAGNOSIS — G4700 Insomnia, unspecified: Secondary | ICD-10-CM

## 2022-10-02 ENCOUNTER — Other Ambulatory Visit: Payer: Self-pay | Admitting: Internal Medicine

## 2022-10-02 DIAGNOSIS — G4733 Obstructive sleep apnea (adult) (pediatric): Secondary | ICD-10-CM

## 2022-10-02 MED ORDER — ALBUTEROL SULFATE HFA 108 (90 BASE) MCG/ACT IN AERS: 2.0000 | INHALATION_SPRAY | Freq: Four times a day (QID) | RESPIRATORY_TRACT | 2 refills | Status: AC | PRN

## 2022-10-12 ENCOUNTER — Other Ambulatory Visit: Payer: Self-pay | Admitting: Psychiatry

## 2022-10-30 ENCOUNTER — Ambulatory Visit: Payer: 59 | Admitting: Adult Health

## 2022-10-30 ENCOUNTER — Encounter: Payer: Self-pay | Admitting: Adult Health

## 2022-10-30 VITALS — BP 131/77 | HR 70 | Ht 64.0 in | Wt 313.0 lb

## 2022-10-30 DIAGNOSIS — G932 Benign intracranial hypertension: Secondary | ICD-10-CM

## 2022-10-30 MED ORDER — ZONISAMIDE 50 MG PO CAPS: 50.0000 mg | ORAL_CAPSULE | Freq: Every day | ORAL | 5 refills | Status: DC

## 2022-10-30 NOTE — Progress Notes (Signed)
[facility] [address] [place] [address] [phone]       OFFICE FOLLOW UP NOTE  [name] Date of Birth:  [birth year] Medical Record Number:  [id number]   Primary neurologist: Dr. Rexene Alberts Reason for visit: Pseudotumor cerebri    SUBJECTIVE:   CHIEF COMPLAINT:  Chief Complaint  Patient presents with   Follow-up    RM 2 with spouse Jill Davis  Pt is well, here to FU on medications making her sick.     HPI:   Update 10/30/2022 JM: Patient returns for follow-up visit regarding right optic disc edema and possible pseudotumor cerebri.  Completed lumbar puncture on 07/31/2022 which showed elevated fluid pressure at 22 and was started on Diamox but due to side effects switched to topiramate but also complained of side effects (see MyChart messages).  She questions other medication options.  Follow-up visit with retina specialist Dr. Jule Ser on 08/07/2022 showed improvement of right optic disc edema. Believes she has a f/u visit next month.   Reports improvement of vision since LP and has been having less headaches.   She is scheduled to [facility] for initial evaluation     History copied from Dr. Guadelupe Sabin prior [place] note for reference purposes only Jill Davis is a 56 year old right-handed woman with an underlying medical history of allergies, arthritis, asthma, anemia, anxiety, depression, reflux disease, vitamin D deficiency, and morbid obesity with a BMI of over 50, who presents for follow-up consultation of her obstructive sleep apnea after home sleep testing and starting AutoPap therapy.  The patient is unaccompanied today. I first met her at the request of her primary care physician on 04/19/2021, at which time she reported snoring and excessive daytime somnolence, as well as difficulty initiating and maintaining sleep.  She was advised to proceed with sleep study.  She had a home sleep test on  05/16/2021 which indicated moderate obstructive sleep apnea with a total AHI of 29.2/hour and O2 nadir of 84%. Snoring ranged from mild to loud.  She was advised to proceed with AutoPap therapy.  Her set up date was 05/12/22.  She has a ResMed air sense 11 AutoSet machine.   Today, 07/11/2022: I reviewed her AutoPap compliance data from 06/10/2022 through 07/09/2022, which is a total of 30 days, during which time she used her machine 27 days with percent use days greater than 4 hours at 80%, indicating very good compliance with an average usage of 7 hours and 12 minutes, residual AHI at goal at 0.9/h, average pressure for the 95th percentile at 8.2 cm with a range of 6 to 12 cm with EPR of 3.  Leak on the higher side with the 95th percentile at 25.5 L/min.  She reports still adjusting to treatment, she is a restless sleeper, she tried the nasal cushion interface but it was uncomfortable and now she is on nasal pillows.  She tried a chinstrap but it was very uncomfortable and caused her to have headaches.  She actually has discontinued using a chinstrap and interestingly her leak is actually lower now.  She has had improvement in her nocturia from 2-3 times per night down to once per night.  Her Epworth sleepiness score is 8 out of 24 today.  She is trying to sleep on her sides.  She feels slightly better with regards to her daytime energy and sleepiness.  She is motivated to continue treatment with AutoPap therapy.   Of note,  she missed an appointment on 06/26/2022 for evaluation/concern for optic disc edema. She was referred by Dr. Jule Ser, retina specialist at the time.  When I explained to her that we do need to make another appointment to talk about this condition and the possibility of underlying pseudotumor cerebri, the patient reported that she did not want this evaluated and that "I don't care anymore". She is worried about additional cost for every specialty visit.  She is worried that her eye doctor would be  upset with her as she has an appointment coming up this week and she has not pursued or does not wish to pursue evaluation and treatment for optic disc edema.  She was advised strongly to make another appointment with me so we can discuss in much more detail her optic disc edema.  During the visit, it was clear that she had major concerns about financial constraints.  I offered her a brief explanation of the condition called pseudotumor cerebri and the prognosis and treatment options as well as diagnostic test options for pseudotumor cerebri.  She was aware that a lumbar puncture is typically recommended.  She is reluctant to proceed but did eventually agreed to a referral to radiology for spinal tap under fluoroscopic guidance.  I explained this procedure to her.  She is apprehensive about this and her follow-up with her retina specialist.  I spent extra time today explaining that it could eventually affect her vision and also in the worst case scenario lead to irreversible vision loss, even blindness.  I reviewed the office note from 04/03/2022, when she saw Dr. Jule Ser.  She reported decreased vision over the past several months.  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 She had an interim brain MRI with and without contrast.  She had a brain and orbital MRI with and without contrast through [facility] on 04/19/2022 and I reviewed the results: IMPRESSION:    1.  Normal MRI of the orbits, specifically no evidence of optic nerve papilledema..    2.  Single foci of FLAIR signal in the right cerebral subcortical white matter nonspecific, but commonly seen with small vessel ischemic change.   I had evaluated her for sleep apnea concern a little over a year ago.   She has been working on weight loss.  She reports that she would not be able to pursue bariatric surgery because her insurance would not cover it.   The patient's allergies, current medications, family history, past medical  history, past social history, past surgical history and problem list were reviewed and updated as appropriate.    Previously:    04/19/21: (She) reports difficulty initiating and maintaining sleep as well as snoring and daytime somnolence.  I reviewed your office note from 02/03/2021.  Her Epworth sleepiness score is 9 out of 24, fatigue severity score is 61 out of 63.  Her husband reports that her snoring can be loud.  She has a family history of sleep apnea affecting her mom and her sister, both have CPAP machines.  The patient has never had a sleep study.  She has tried Ambien in the past and was on it for some years, then was off for several years and recently restarted it.  She has a bedtime of around 10 PM and rise time of around 7 or 7:30 AM.  She drinks caffeine in the form of soda, about 3 servings per average day, no alcohol and no drugs for over 30 years.  She quit smoking about a year and a half ago.  She lives with her husband, they have 2 cats in the household, she does sleep with the TV on at night in her bedroom.  She has a history of bruxism and has tried an over-the-counter bite guard.  She has tried over-the-counter medication for sleep including ZzzQuil and melatonin.  She does nap during the day when she can, not daily.  She works mostly from home.  She has gained maybe 25 to 30 pounds in the past 2 years.  She had a tonsillectomy as a child.  She has had occasional morning headaches and has nocturia about once per average night.       ROS:   14 system review of systems performed and negative with exception of listed in HPI  PMH:  Past Medical History:  Diagnosis Date   Allergy    grass    Anemia    Anxiety    Arthritis    knees x 2   and hip on left    Asthma    none since moved to [place]    Depression    GERD (gastroesophageal reflux disease)    PRn Rolaids -  gets with red sauces etc    Heart murmur    as baby    History of substance abuse ([place])    Insomnia     Morbid obesity ([place])    Neuromuscular disorder ([place])    plantar fascitis    Substance abuse ([place])    Thyroid disease    currently no meds     PSH:  Past Surgical History:  Procedure Laterality Date   ABDOMINAL HYSTERECTOMY     BREAST BIOPSY Left 11/21/2015   benign   COLONOSCOPY WITH PROPOFOL N/A 10/05/2020   Procedure: COLONOSCOPY WITH PROPOFOL;  Surgeon: Thornton Park, MD;  Location: [facility];  Service: Gastroenterology;  Laterality: N/A;   cyst on buttocks      PARTIAL HYSTERECTOMY     prior to abd full hysterectomy   POLYPECTOMY  10/05/2020   Procedure: POLYPECTOMY;  Surgeon: Thornton Park, MD;  Location: [facility];  Service: Gastroenterology;;    Social History:  Social History   Socioeconomic History   Marital status: Married    Spouse name: Not on file   Number of children: Not on file   Years of education: Not on file   Highest education level: Not on file  Occupational History   Not on file  Tobacco Use   Smoking status: Former   Smokeless tobacco: Never  Substance and Sexual Activity   Alcohol use: Not Currently   Drug use: Yes    Types: Cocaine, Marijuana, "Crack" cocaine, Heroin    Comment: quit 1990   Sexual activity: Not on file  Other Topics Concern   Not on file  Social History Narrative   Not on file   Social Determinants of Health   Financial Resource Strain: Not on file  Food Insecurity: Not on file  Transportation Needs: Not on file  Physical Activity: Not on file  Stress: Not on file  Social Connections: Not on file  Intimate Partner Violence: Not on file    Family History:  Family History  Problem Relation Age of Onset   Diabetes Mother    Hypertension Mother    Sleep apnea Mother    CAD Father    Hypertension Father    Sleep apnea Father    Breast cancer Sister  38s   Sleep apnea Sister    Hypertension Brother    Colon cancer Neg Hx    Colon polyps Neg Hx    Esophageal cancer Neg Hx    Stomach  cancer Neg Hx    Rectal cancer Neg Hx     Medications:   Current Outpatient Medications on File Prior to Visit  Medication Sig Dispense Refill   albuterol (VENTOLIN HFA) 108 (90 Base) MCG/ACT inhaler Inhale 2 puffs into the lungs every 6 (six) hours as needed for wheezing or shortness of breath. 8 g 2   buPROPion (WELLBUTRIN XL) 150 MG 24 hr tablet Take 1 tablet (150 mg total) by mouth daily. 90 tablet 1   Calcium Carb-Cholecalciferol (CALCIUM 600/VITAMIN D PO) Take by mouth.     diphenhydramine-acetaminophen (TYLENOL PM) 25-500 MG TABS tablet Take 2 tablets by mouth at bedtime as needed (sleep.).     ibuprofen (ADVIL) 200 MG tablet Take 1,000 mg by mouth every 8 (eight) hours as needed (for pain.).      ofloxacin (OCUFLOX) 0.3 % ophthalmic solution Place 1 drop into the left eye 4 (four) times daily. 5 mL 0   triamcinolone cream (KENALOG) 0.1 % Apply 1 application topically 2 (two) times daily. 30 g 0   zolpidem (AMBIEN) 10 MG tablet TAKE 1 TABLET BY MOUTH EVERY DAY AT BEDTIME AS NEEDED FOR SLEEP 15 tablet 1   zolpidem (AMBIEN) 5 MG tablet Take 5 mg by mouth at bedtime.     No current facility-administered medications on file prior to visit.    Allergies:   Allergies  Allergen Reactions   Celebrex [Celecoxib] Itching      OBJECTIVE:  Physical Exam  Vitals:   10/30/22 0834  BP: 131/77  Pulse: 70  Weight: (!) 313 lb (142 kg)  Height: _0  (1.626 m)   Body mass index is 53.73 kg/m. No results found.   General: Morbidly obese pleasant middle-age female, seated, in no evident distress Head: head normocephalic and atraumatic.   Neck: supple with no carotid or supraclavicular bruits Cardiovascular: regular rate and rhythm, no murmurs Musculoskeletal: no deformity Skin:  no rash/petichiae Vascular:  Normal pulses all extremities   Neurologic Exam Mental Status: Awake and fully alert. Oriented to place and time. Recent and remote memory intact. Attention span,  concentration and fund of knowledge appropriate. Mood and affect appropriate.  Cranial Nerves: Pupils equal, briskly reactive to light. Extraocular movements full without nystagmus. Visual fields full to confrontation. Hearing intact. Facial sensation intact. Face, tongue, palate moves normally and symmetrically.  Motor: Normal bulk and tone. Normal strength in all tested extremity muscles Sensory.: intact to touch , pinprick , position and vibratory sensation.  Coordination: Rapid alternating movements normal in all extremities. Finger-to-nose and heel-to-shin performed accurately bilaterally. Gait and Station: Arises from chair without difficulty. Stance is normal. Gait demonstrates normal stride length and balance without use of AD. Tandem walk and heel toe without difficulty.  Reflexes: 1+ and symmetric. Toes downgoing.         ASSESSMENT/PLAN: Jill Davis is a 56 y.o. year old female with pseudotumor cerebri   1.  Pseudotumor cerebri -LP 9/11 opening pressure 22 -Optho exam 9/18 showed improvement of OD optic disc edema, has f/u next month -improvement of blurred vision and headaches since LP -recommend starting zonisamide 79m nightly for 1 week then will plan on increasing to 1029mnightly if tolerating well. Starting at lower dosage due to prior medication intolerances -Intolerant to Diamox and  topiramate -Discussed importance of weight loss, is scheduled to [facility] for initial evaluation -Advised to call office and see ophthalmologist with any worsening vision or increased headaches    Follow-up currently scheduled with Dr. Rexene Alberts in August. Will have patient return sooner if needed    CC:  PCP: Isaac Bliss, Rayford Halsted, MD    I spent 26 minutes of face-to-face and non-face-to-face time with patient and husband.  This included previsit chart review, lab review, study review, order entry, electronic health record  documentation, patient education and discussion regarding above diagnoses and treatment plan and answered all the questions to patient's satisfaction   Frann Rider, [facility]  [facility] [address] [place], [place] [address]  Phone [phone] Fax [phone] Note: This document was prepared with digital dictation and possible smart phrase technology. Any transcriptional errors that result from this process are unintentional.

## 2022-10-30 NOTE — Patient Instructions (Addendum)
Recommend starting zonisamide '50mg'$  nightly - if tolerating well over the next week, will plan on increasing the dose to either '100mg'$  nightly or '50mg'$  twice daily - we will reach out to your next week for follow up on this

## 2022-10-31 ENCOUNTER — Ambulatory Visit: Payer: 59 | Admitting: Adult Health

## 2022-11-06 ENCOUNTER — Encounter: Payer: Self-pay | Admitting: Adult Health

## 2022-11-23 ENCOUNTER — Encounter (INDEPENDENT_AMBULATORY_CARE_PROVIDER_SITE_OTHER): Payer: 59 | Admitting: Family Medicine

## 2022-12-05 ENCOUNTER — Other Ambulatory Visit: Payer: Self-pay | Admitting: Internal Medicine

## 2022-12-05 DIAGNOSIS — G4700 Insomnia, unspecified: Secondary | ICD-10-CM

## 2022-12-14 ENCOUNTER — Encounter: Payer: Self-pay | Admitting: Adult Health

## 2022-12-14 MED ORDER — ZONISAMIDE 50 MG PO CAPS: 50.0000 mg | ORAL_CAPSULE | Freq: Two times a day (BID) | ORAL | 5 refills | Status: DC

## 2022-12-14 NOTE — Telephone Encounter (Signed)
During last visit in Dec, you noted Recommend starting zonisamide '50mg'$  nightly - if tolerating well over the next week, will plan on increasing the dose to either '100mg'$  nightly or '50mg'$  twice daily. you reached out to the patient via mychart to see how she was doing on the current dose. she was tolerating well so i don't believe the increase was done. you sent in Rx for just one tab '50mg'$  daily. Did you actually want that increase or stay at '50mg'$  nightly?

## 2022-12-28 ENCOUNTER — Telehealth (INDEPENDENT_AMBULATORY_CARE_PROVIDER_SITE_OTHER): Payer: 59 | Admitting: Adult Health

## 2022-12-28 ENCOUNTER — Encounter: Payer: Self-pay | Admitting: Adult Health

## 2022-12-28 ENCOUNTER — Encounter: Payer: Self-pay | Admitting: Internal Medicine

## 2022-12-28 VITALS — Temp 98.0°F | Ht 64.0 in | Wt 313.0 lb

## 2022-12-28 DIAGNOSIS — U071 COVID-19: Secondary | ICD-10-CM

## 2022-12-28 LAB — POC COVID19 BINAXNOW: SARS Coronavirus 2 Ag: POSITIVE — AB

## 2022-12-28 LAB — POCT INFLUENZA A/B
Influenza A, POC: NEGATIVE
Influenza B, POC: NEGATIVE

## 2022-12-28 NOTE — Progress Notes (Signed)
Virtual Visit via Video Note  I connected with Jill Davis on 12/28/22 at 11:30 AM EST by a video enabled telemedicine application and verified that I am speaking with the correct person using two identifiers.  Location patient: home Location provider:work or home office Persons participating in the virtual visit: patient, provider  I discussed the limitations of evaluation and management by telemedicine and the availability of in person appointments. The patient expressed understanding and agreed to proceed.   HPI:  57 year old female who is being evaluated today for an acute issue.  She reports that her symptoms started 4 to 5 days ago but when she woke up this morning she felt back to baseline.  Symptoms included headache, diarrhea, fevers, chills, and fatigue.  She tested positive for COVID-19 today.  ROS: See pertinent positives and negatives per HPI.  Past Medical History:  Diagnosis Date   Allergy    grass    Anemia    Anxiety    Arthritis    knees x 2   and hip on left    Asthma    none since moved to [place]    Depression    GERD (gastroesophageal reflux disease)    PRn Rolaids -  gets with red sauces etc    Heart murmur    as baby    History of substance abuse ([place])    Insomnia    Morbid obesity ([place])    Neuromuscular disorder ([place])    plantar fascitis    Substance abuse ([place])    Thyroid disease    currently no meds     Past Surgical History:  Procedure Laterality Date   ABDOMINAL HYSTERECTOMY     BREAST BIOPSY Left 11/21/2015   benign   COLONOSCOPY WITH PROPOFOL N/A 10/05/2020   Procedure: COLONOSCOPY WITH PROPOFOL;  Surgeon: Thornton Park, MD;  Location: [facility];  Service: Gastroenterology;  Laterality: N/A;   cyst on buttocks      PARTIAL HYSTERECTOMY     prior to abd full hysterectomy   POLYPECTOMY  10/05/2020   Procedure: POLYPECTOMY;  Surgeon: Thornton Park, MD;  Location: [facility];  Service: Gastroenterology;;    Family History   Problem Relation Age of Onset   Diabetes Mother    Hypertension Mother    Sleep apnea Mother    CAD Father    Hypertension Father    Sleep apnea Father    Breast cancer Sister        43s   Sleep apnea Sister    Hypertension Brother    Colon cancer Neg Hx    Colon polyps Neg Hx    Esophageal cancer Neg Hx    Stomach cancer Neg Hx    Rectal cancer Neg Hx        Current Outpatient Medications:    albuterol (VENTOLIN HFA) 108 (90 Base) MCG/ACT inhaler, Inhale 2 puffs into the lungs every 6 (six) hours as needed for wheezing or shortness of breath., Disp: 8 g, Rfl: 2   buPROPion (WELLBUTRIN XL) 150 MG 24 hr tablet, Take 1 tablet (150 mg total) by mouth daily., Disp: 90 tablet, Rfl: 1   Calcium Carb-Cholecalciferol (CALCIUM 600/VITAMIN D PO), Take by mouth., Disp: , Rfl:    diphenhydramine-acetaminophen (TYLENOL PM) 25-500 MG TABS tablet, Take 2 tablets by mouth at bedtime as needed (sleep.)., Disp: , Rfl:    ibuprofen (ADVIL) 200 MG tablet, Take 1,000 mg by mouth every 8 (eight) hours as needed (for pain.). , Disp: , Rfl:  ofloxacin (OCUFLOX) 0.3 % ophthalmic solution, Place 1 drop into the left eye 4 (four) times daily., Disp: 5 mL, Rfl: 0   triamcinolone cream (KENALOG) 0.1 %, Apply 1 application topically 2 (two) times daily., Disp: 30 g, Rfl: 0   zolpidem (AMBIEN) 10 MG tablet, TAKE 1 TABLET BY MOUTH AT BEDTIME AS NEEDED FOR SLEEP, Disp: 15 tablet, Rfl: 1   zolpidem (AMBIEN) 5 MG tablet, Take 5 mg by mouth at bedtime., Disp: , Rfl:    zonisamide (ZONEGRAN) 50 MG capsule, Take 1 capsule (50 mg total) by mouth in the morning and at bedtime., Disp: 60 capsule, Rfl: 5  EXAM:  VITALS per patient if applicable:  GENERAL: alert, oriented, appears well and in no acute distress  HEENT: atraumatic, conjunttiva clear, no obvious abnormalities on inspection of external nose and ears  NECK: normal movements of the head and neck  LUNGS: on inspection no signs of respiratory distress,  breathing rate appears normal, no obvious gross SOB, gasping or wheezing  CV: no obvious cyanosis  MS: moves all visible extremities without noticeable abnormality  PSYCH/NEURO: pleasant and cooperative, no obvious depression or anxiety, speech and thought processing grossly intact  ASSESSMENT AND PLAN:  Discussed the following assessment and plan:  1. COVID-19 -Sounds like she is over the worst of her COVID-19 faction.  Encouraged fluids and rest over the weekend.  I do not see a reason why she should be treated with Paxlovid due to being back to her normal self. - POCT Influenza A/B- negative - POC COVID-19 BinaxNow- positive       I discussed the assessment and treatment plan with the patient. The patient was provided an opportunity to ask questions and all were answered. The patient agreed with the plan and demonstrated an understanding of the instructions.   The patient was advised to call back or seek an in-person evaluation if the symptoms worsen or if the condition fails to improve as anticipated.   Dorothyann Peng, NP

## 2022-12-29 NOTE — Telephone Encounter (Signed)
Please advise 

## 2023-01-01 ENCOUNTER — Ambulatory Visit: Payer: 59 | Admitting: Internal Medicine

## 2023-02-01 ENCOUNTER — Other Ambulatory Visit: Payer: Self-pay | Admitting: Internal Medicine

## 2023-02-01 DIAGNOSIS — F331 Major depressive disorder, recurrent, moderate: Secondary | ICD-10-CM

## 2023-02-22 ENCOUNTER — Other Ambulatory Visit: Payer: Self-pay | Admitting: Internal Medicine

## 2023-02-22 DIAGNOSIS — G4700 Insomnia, unspecified: Secondary | ICD-10-CM

## 2023-03-07 ENCOUNTER — Encounter (HOSPITAL_COMMUNITY): Payer: Self-pay

## 2023-03-07 ENCOUNTER — Other Ambulatory Visit: Payer: Self-pay

## 2023-03-07 ENCOUNTER — Emergency Department (HOSPITAL_COMMUNITY): Payer: 59

## 2023-03-07 ENCOUNTER — Emergency Department (HOSPITAL_COMMUNITY)
Admission: EM | Admit: 2023-03-07 | Discharge: 2023-03-08 | Disposition: A | Discharge status: Home / Self Care | Payer: 59 | Attending: Emergency Medicine | Admitting: Emergency Medicine

## 2023-03-07 DIAGNOSIS — D72829 Elevated white blood cell count, unspecified: Secondary | ICD-10-CM | POA: Diagnosis not present

## 2023-03-07 DIAGNOSIS — J45901 Unspecified asthma with (acute) exacerbation: Secondary | ICD-10-CM | POA: Diagnosis not present

## 2023-03-07 DIAGNOSIS — Z87891 Personal history of nicotine dependence: Secondary | ICD-10-CM | POA: Diagnosis not present

## 2023-03-07 DIAGNOSIS — R051 Acute cough: Secondary | ICD-10-CM

## 2023-03-07 DIAGNOSIS — Z1152 Encounter for screening for COVID-19: Secondary | ICD-10-CM | POA: Insufficient documentation

## 2023-03-07 DIAGNOSIS — R059 Cough, unspecified: Secondary | ICD-10-CM | POA: Diagnosis present

## 2023-03-07 DIAGNOSIS — M940 Chondrocostal junction syndrome [Tietze]: Secondary | ICD-10-CM | POA: Insufficient documentation

## 2023-03-07 DIAGNOSIS — J4521 Mild intermittent asthma with (acute) exacerbation: Secondary | ICD-10-CM

## 2023-03-07 LAB — CBC WITH DIFFERENTIAL/PLATELET
Abs Immature Granulocytes: 0.02 10*3/uL (ref 0.00–0.07)
Basophils Absolute: 0.1 10*3/uL (ref 0.0–0.1)
Basophils Relative: 0 %
Eosinophils Absolute: 0.2 10*3/uL (ref 0.0–0.5)
Eosinophils Relative: 1 %
HCT: 39.6 % (ref 36.0–46.0)
Hemoglobin: 12.8 g/dL (ref 12.0–15.0)
Immature Granulocytes: 0 %
Lymphocytes Relative: 20 %
Lymphs Abs: 2.4 10*3/uL (ref 0.7–4.0)
MCH: 27.1 pg (ref 26.0–34.0)
MCHC: 32.3 g/dL (ref 30.0–36.0)
MCV: 83.7 fL (ref 80.0–100.0)
Monocytes Absolute: 0.7 10*3/uL (ref 0.1–1.0)
Monocytes Relative: 5 %
Neutro Abs: 9.2 10*3/uL — ABNORMAL HIGH (ref 1.7–7.7)
Neutrophils Relative %: 74 %
Platelets: 265 10*3/uL (ref 150–400)
RBC: 4.73 MIL/uL (ref 3.87–5.11)
RDW: 15.2 % (ref 11.5–15.5)
WBC: 12.5 10*3/uL — ABNORMAL HIGH (ref 4.0–10.5)
nRBC: 0 % (ref 0.0–0.2)

## 2023-03-07 LAB — RESP PANEL BY RT-PCR (RSV, FLU A&B, COVID)  RVPGX2
Influenza A by PCR: NEGATIVE
Influenza B by PCR: NEGATIVE
Resp Syncytial Virus by PCR: NEGATIVE
SARS Coronavirus 2 by RT PCR: NEGATIVE

## 2023-03-07 LAB — BASIC METABOLIC PANEL
Anion gap: 10 (ref 5–15)
BUN: 11 mg/dL (ref 6–20)
CO2: 20 mmol/L — ABNORMAL LOW (ref 22–32)
Calcium: 8.9 mg/dL (ref 8.9–10.3)
Chloride: 107 mmol/L (ref 98–111)
Creatinine, Ser: 0.84 mg/dL (ref 0.44–1.00)
GFR, Estimated: >60 mL/min (ref >60)
Glucose, Bld: 92 mg/dL (ref 70–99)
Potassium: 3.3 mmol/L — ABNORMAL LOW (ref 3.5–5.1)
Sodium: 137 mmol/L (ref 135–145)

## 2023-03-07 LAB — TROPONIN I (HIGH SENSITIVITY)
Troponin I (High Sensitivity): 2 ng/L (ref <18)
Troponin I (High Sensitivity): 3 ng/L (ref <18)

## 2023-03-07 LAB — BRAIN NATRIURETIC PEPTIDE: B Natriuretic Peptide: 35.1 pg/mL (ref 0.0–100.0)

## 2023-03-07 MED ORDER — ACETAMINOPHEN 325 MG PO TABS: 650.0000 mg | ORAL_TABLET | Freq: Four times a day (QID) | ORAL | 0 refills | Status: DC | PRN

## 2023-03-07 MED ORDER — OXYCODONE-ACETAMINOPHEN 5-325 MG PO TABS
1.0000 | ORAL_TABLET | Freq: Once | ORAL | Status: AC
  Administered 2023-03-07: 1 via ORAL
  Filled 2023-03-07: qty 1

## 2023-03-07 MED ORDER — POTASSIUM CHLORIDE CRYS ER 20 MEQ PO TBCR
40.0000 meq | EXTENDED_RELEASE_TABLET | Freq: Once | ORAL | Status: AC
  Administered 2023-03-07: 40 meq via ORAL
  Filled 2023-03-07: qty 2

## 2023-03-07 MED ORDER — METHYLPREDNISOLONE SODIUM SUCC 125 MG IJ SOLR
62.5000 mg | Freq: Once | INTRAMUSCULAR | Status: AC
  Administered 2023-03-07: 62.5 mg via INTRAVENOUS
  Filled 2023-03-07: qty 2

## 2023-03-07 MED ORDER — AZITHROMYCIN 250 MG PO TABS: 250.0000 mg | ORAL_TABLET | Freq: Every day | ORAL | 0 refills | Status: DC

## 2023-03-07 MED ORDER — PREDNISONE 50 MG PO TABS: 50.0000 mg | ORAL_TABLET | Freq: Every day | ORAL | 0 refills | Status: AC

## 2023-03-07 MED ORDER — IPRATROPIUM-ALBUTEROL 0.5-2.5 (3) MG/3ML IN SOLN
3.0000 mL | Freq: Once | RESPIRATORY_TRACT | Status: AC
  Administered 2023-03-07: 3 mL via RESPIRATORY_TRACT
  Filled 2023-03-07: qty 3

## 2023-03-07 MED ORDER — KETOROLAC TROMETHAMINE 15 MG/ML IJ SOLN
15.0000 mg | Freq: Once | INTRAMUSCULAR | Status: AC
  Administered 2023-03-07: 15 mg via INTRAVENOUS
  Filled 2023-03-07: qty 1

## 2023-03-07 MED ORDER — ALBUTEROL SULFATE HFA 108 (90 BASE) MCG/ACT IN AERS: 1.0000 | INHALATION_SPRAY | RESPIRATORY_TRACT | 0 refills | Status: DC | PRN

## 2023-03-07 MED ORDER — GUAIFENESIN-DM 100-10 MG/5ML PO SYRP: 5.0000 mL | ORAL_SOLUTION | ORAL | 0 refills | Status: DC | PRN

## 2023-03-07 MED ORDER — IOHEXOL 350 MG/ML SOLN
80.0000 mL | Freq: Once | INTRAVENOUS | Status: AC | PRN
  Administered 2023-03-07: 100 mL via INTRAVENOUS

## 2023-03-07 MED ORDER — LIDOCAINE 5 % EX PTCH
1.0000 | MEDICATED_PATCH | Freq: Once | CUTANEOUS | Status: DC
  Administered 2023-03-07: 1 via TRANSDERMAL
  Filled 2023-03-07: qty 1

## 2023-03-07 MED ORDER — HYDROMORPHONE HCL 1 MG/ML IJ SOLN
1.0000 mg | Freq: Once | INTRAMUSCULAR | Status: AC
  Administered 2023-03-07: 1 mg via INTRAVENOUS
  Filled 2023-03-07: qty 1

## 2023-03-07 MED ORDER — CETIRIZINE HCL 10 MG PO TABS: 10.0000 mg | ORAL_TABLET | Freq: Every day | ORAL | 0 refills | Status: DC

## 2023-03-07 MED ORDER — ONDANSETRON HCL 4 MG/2ML IJ SOLN
4.0000 mg | Freq: Once | INTRAMUSCULAR | Status: AC
  Administered 2023-03-07: 4 mg via INTRAVENOUS
  Filled 2023-03-07: qty 2

## 2023-03-07 MED ORDER — OXYCODONE HCL 5 MG PO TABS: 5.0000 mg | ORAL_TABLET | ORAL | 0 refills | Status: DC | PRN

## 2023-03-07 MED ORDER — SODIUM CHLORIDE 0.9 % IV BOLUS
1000.0000 mL | Freq: Once | INTRAVENOUS | Status: AC
  Administered 2023-03-07: 1000 mL via INTRAVENOUS

## 2023-03-07 MED ORDER — OXYMETAZOLINE HCL 0.05 % NA SOLN: 1.0000 | Freq: Two times a day (BID) | NASAL | 0 refills | Status: AC

## 2023-03-07 NOTE — ED Triage Notes (Signed)
SOB with productive cough for 2 days. Pt is coughing up dark green mucus. C/o right sided back pain "in lung area" that is worse with coughing and deep breathing. Dyspnea at rest and exertion. Pt has labored breathing and tachypnea on arrival

## 2023-03-07 NOTE — Discharge Instructions (Signed)
It was a pleasure caring for you today in the emergency department.  Please stop using tobacco products or vaping  Please follow up with your PCP on Monday  Please return to the emergency department for any worsening or worrisome symptoms.

## 2023-03-07 NOTE — ED Provider Triage Note (Signed)
Emergency Medicine Provider Triage Evaluation Note  Jill Davis , a 57 y.o. female  was evaluated in triage.  Pt complains of shortness of breath, chest pain ongoing for several days but worsen over the last two days. Coughing green phlegm with blood present. Also pain along the posterior right scapula. Has taken medication OTC without any improvement in symptoms. Patient had COVID in the month of February.   Review of Systems  Positive: Sob, cough, hematemesis, chest pain Negative: fever  Physical Exam  BP (!) 143/93   Pulse 74   Temp 98.3 F (36.8 C) (Oral)   Resp (!) 28   Ht  (1.626 m)   Wt (!) 142.9 kg   SpO2 99%   BMI 54.07 kg/m  Gen:   Awake, no distress   Resp:  Normal effort  MSK:   Moves extremities without difficulty  Other:  Tachypnea, lungs are diminished to auscultation  Medical Decision Making  Medically screening exam initiated at 6:44 PM.  Appropriate orders placed.  Harmoney Walker-Gardner was informed that the remainder of the evaluation will be completed by another provider, this initial triage assessment does not replace that evaluation, and the importance of remaining in the ED until their evaluation is complete.     Claude Manges, PA-C 03/07/23 1849

## 2023-03-07 NOTE — ED Provider Notes (Signed)
Jill Davis  CSN: [id number] Arrival date & time: 03/07/23 1821  Chief Complaint(s) Shortness of Breath and Cough  HPI Jill Davis is a 57 y.o. female with past medical history as below, significant for asthma, depression, GERD, obesity who presents to the ED with complaint of chest pain, dyspnea, cough.  Symptom onset 3 days ago, patient reports right-sided chest pain described as pleuritic and at times sharp and stabbing.  Productive cough with green sputum, occasionally streaked with blood.  Chest tightness, increased difficulty breathing.  No fevers but possible chills.  No arthralgias.  No nausea or vomiting.  No medications prior to arrival.  Does have asthma but does not use her inhaler in multiple years.  She stopped smoking cigarettes but vapes now instead.  Past Medical History Past Medical History:  Diagnosis Date   Allergy    grass    Anemia    Anxiety    Arthritis    knees x 2   and hip on left    Asthma    none since moved to [place]    Depression    GERD (gastroesophageal reflux disease)    PRn Rolaids -  gets with red sauces etc    Heart murmur    as baby    History of substance abuse    Insomnia    Morbid obesity    Neuromuscular disorder    plantar fascitis    Substance abuse    Thyroid disease    currently no meds    Patient Active Problem List   Diagnosis Date Noted   Polyp of descending colon    Vitamin D deficiency 07/08/2020   Morbid obesity    Depression    Insomnia    History of substance abuse    Home Medication(s) Prior to Admission medications   Medication Sig Start Date End Date Taking? Authorizing Provider  acetaminophen (TYLENOL) 325 MG tablet Take 2 tablets (650 mg total) by mouth every 6 (six) hours as needed. 03/07/23  Yes Tanda Rockers A, DO  albuterol (VENTOLIN HFA) 108 (90 Base) MCG/ACT inhaler Inhale 1-2 puffs into the lungs every 4 (four) hours as needed for wheezing  or shortness of breath. 03/07/23  Yes Tanda Rockers A, DO  azithromycin (ZITHROMAX) 250 MG tablet Take 1 tablet (250 mg total) by mouth daily. Take first 2 tablets together, then 1 every day until finished. 03/07/23  Yes Sloan Leiter, DO  cetirizine (ZYRTEC ALLERGY) 10 MG tablet Take 1 tablet (10 mg total) by mouth daily for 14 days. 03/07/23 03/21/23 Yes Sloan Leiter, DO  guaiFENesin-dextromethorphan (ROBITUSSIN DM) 100-10 MG/5ML syrup Take 5 mLs by mouth every 4 (four) hours as needed for cough. 03/07/23  Yes Tanda Rockers A, DO  oxyCODONE (ROXICODONE) 5 MG immediate release tablet Take 1 tablet (5 mg total) by mouth every 4 (four) hours as needed for severe pain. 03/07/23  Yes Sloan Leiter, DO  oxymetazoline (AFRIN NASAL SPRAY) 0.05 % nasal spray Place 1 spray into both nostrils 2 (two) times daily for 3 days. 03/07/23 03/10/23 Yes Sloan Leiter, DO  predniSONE (DELTASONE) 50 MG tablet Take 1 tablet (50 mg total) by mouth daily for 5 days. 03/07/23 03/12/23 Yes Tanda Rockers A, DO  albuterol (VENTOLIN HFA) 108 (90 Base) MCG/ACT inhaler Inhale 2 puffs into the lungs every 6 (six) hours as needed for wheezing or shortness of breath. 10/02/22   Henderson Cloud, MD  buPROPion (WELLBUTRIN XL) 150 MG 24 hr tablet TAKE 1 TABLET BY MOUTH EVERY DAY 02/01/23   Philip Aspen, Limmie Patricia, MD  Calcium Carb-Cholecalciferol (CALCIUM 600/VITAMIN D PO) Take by mouth.    [provider]  diphenhydramine-acetaminophen (TYLENOL PM) 25-500 MG TABS tablet Take 2 tablets by mouth at bedtime as needed (sleep.).    [provider]  ibuprofen (ADVIL) 200 MG tablet Take 1,000 mg by mouth every 8 (eight) hours as needed (for pain.).     [provider]  ofloxacin (OCUFLOX) 0.3 % ophthalmic solution Place 1 drop into the left eye 4 (four) times daily. 05/12/21   Philip Aspen, Limmie Patricia, MD  triamcinolone cream (KENALOG) 0.1 % Apply 1 application topically 2 (two) times daily. 05/12/21   Philip Aspen, Limmie Patricia, MD  zolpidem (AMBIEN) 10 MG tablet TAKE 1 TABLET BY MOUTH EVERY DAY AT BEDTIME AS NEEDED FOR SLEEP 02/22/23   Philip Aspen, Limmie Patricia, MD  zolpidem (AMBIEN) 5 MG tablet Take 5 mg by mouth at bedtime.    [provider]  zonisamide (ZONEGRAN) 50 MG capsule Take 1 capsule (50 mg total) by mouth in the morning and at bedtime. 12/14/22   Ihor Austin, NP                                                                                                                                    Past Surgical History Past Surgical History:  Procedure Laterality Date   ABDOMINAL HYSTERECTOMY     BREAST BIOPSY Left 11/21/2015   benign   COLONOSCOPY WITH PROPOFOL N/A 10/05/2020   Procedure: COLONOSCOPY WITH PROPOFOL;  Surgeon: Tressia Danas, MD;  Location: [facility];  Service: Gastroenterology;  Laterality: N/A;   cyst on buttocks      PARTIAL HYSTERECTOMY     prior to abd full hysterectomy   POLYPECTOMY  10/05/2020   Procedure: POLYPECTOMY;  Surgeon: Tressia Danas, MD;  Location: [facility];  Service: Gastroenterology;;   Family History Family History  Problem Relation Age of Onset   Diabetes Mother    Hypertension Mother    Sleep apnea Mother    CAD Father    Hypertension Father    Sleep apnea Father    Breast cancer Sister        54s   Sleep apnea Sister    Hypertension Brother    Colon cancer Neg Hx    Colon polyps Neg Hx    Esophageal cancer Neg Hx    Stomach cancer Neg Hx    Rectal cancer Neg Hx     Social History Social History   Tobacco Use   Smoking status: Former   Smokeless tobacco: Never  Substance Use Topics   Alcohol use: Not Currently   Drug use: Yes    Types: Cocaine, Marijuana, "Crack" cocaine, Heroin    Comment: quit 1990   Allergies Celebrex [celecoxib]  Review of Systems Review  of Systems  Constitutional:  Positive for chills. Negative for activity change and fever.  HENT:  Negative for facial swelling and trouble  swallowing.   Eyes:  Negative for discharge and redness.  Respiratory:  Positive for cough, chest tightness and shortness of breath.   Cardiovascular:  Positive for chest pain. Negative for palpitations.  Gastrointestinal:  Negative for abdominal pain and nausea.  Genitourinary:  Negative for dysuria and flank pain.  Musculoskeletal:  Negative for back pain and gait problem.  Skin:  Negative for pallor and rash.  Neurological:  Negative for syncope and headaches.    Physical Exam Vital Signs  I have reviewed the triage vital signs BP (!) 148/89   Pulse 80   Temp 98 F (36.7 C)   Resp 14   Ht 5\' 4"  (1.626 m)   Wt (!) 142.9 kg   SpO2 100%   BMI 54.07 kg/m  Physical Exam Vitals and nursing Davis reviewed.  Constitutional:      General: She is not in acute distress.    Appearance: Normal appearance. She is well-developed.  HENT:     Head: Normocephalic and atraumatic.     Right Ear: External ear normal.     Left Ear: External ear normal.     Nose: Nose normal.     Mouth/Throat:     Mouth: Mucous membranes are moist.  Eyes:     General: No scleral icterus.       Right eye: No discharge.        Left eye: No discharge.  Cardiovascular:     Rate and Rhythm: Normal rate and regular rhythm.     Pulses: Normal pulses.     Heart sounds: Normal heart sounds.  Pulmonary:     Effort: Pulmonary effort is normal. No respiratory distress.     Breath sounds: Wheezing present.  Abdominal:     General: Abdomen is flat.     Palpations: Abdomen is soft.     Tenderness: There is no abdominal tenderness. There is no guarding.  Musculoskeletal:       Arms:     Right lower leg: No edema.     Left lower leg: No edema.  Skin:    General: Skin is warm and dry.     Capillary Refill: Capillary refill takes less than 2 seconds.  Neurological:     Mental Status: She is alert and oriented to person, place, and time.     GCS: GCS eye subscore is 4. GCS verbal subscore is 5. GCS motor subscore  is 6.  Psychiatric:        Mood and Affect: Mood normal.        Behavior: Behavior normal.     ED Results and Treatments Labs (all labs ordered are listed, but only abnormal results are displayed) Labs Reviewed  BASIC METABOLIC PANEL - Abnormal; Notable for the following components:      Result Value   Potassium 3.3 (*)    CO2 20 (*)    All other components within normal limits  CBC WITH DIFFERENTIAL/PLATELET - Abnormal; Notable for the following components:   WBC 12.5 (*)    Neutro Abs 9.2 (*)    All other components within normal limits  RESP PANEL BY RT-PCR (RSV, FLU A&B, COVID)  RVPGX2  BRAIN NATRIURETIC PEPTIDE  TROPONIN I (HIGH SENSITIVITY)  TROPONIN I (HIGH SENSITIVITY)  Radiology CT Angio Chest PE W and/or Wo Contrast  Result Date: 03/07/2023 CLINICAL DATA:  Pulmonary embolism (PE) suspected, high prob Chest pain. Right-sided back pain. EXAM: CT ANGIOGRAPHY CHEST WITH CONTRAST TECHNIQUE: Multidetector CT imaging of the chest was performed using the standard protocol during bolus administration of intravenous contrast. Multiplanar CT image reconstructions and MIPs were obtained to evaluate the vascular anatomy. RADIATION DOSE REDUCTION: This exam was performed according to the departmental dose-optimization program which includes automated exposure control, adjustment of the mA and/or kV according to patient size and/or use of iterative reconstruction technique. CONTRAST:  OMNIPAQUE IOHEXOL 350 MG/ML SOLN COMPARISON:  Radiograph earlier today. Chest CTA 06/15/2021 FINDINGS: Cardiovascular: There are no filling defects within the pulmonary arteries to suggest pulmonary embolus. Normal caliber thoracic aorta without acute aortic findings. The heart is normal in size. No pericardial effusion. Mediastinum/Nodes: Calcified mediastinal and right hilar lymph nodes  consistent with prior granulomatous disease. No noncalcified adenopathy. No esophageal wall thickening. 3.1 cm hypodense right thyroid nodule. This has been evaluated on previous imaging. (ref: J Am Coll Radiol. 2015 Feb;12(2): 143-50).Thyroid ultrasound 07/19/2021 and biopsy 08/04/2021. Lungs/Pleura: Right lower lobe calcified granuloma. Mild heterogeneity of pulmonary parenchyma. No focal airspace disease. No pleural effusion. Upper Abdomen: Calcified splenic granuloma. No acute upper abdominal findings. Musculoskeletal: There are no acute or suspicious osseous abnormalities. Mild thoracic spondylosis with spurring. No musculoskeletal findings to account for pain. Review of the MIP images confirms the above findings. IMPRESSION: 1. No pulmonary embolus. 2. Mild heterogeneity of pulmonary parenchyma, can be seen with small airways disease. 3. Sequela of prior granulomatous disease with calcified granuloma in the right lower lobe and calcified mediastinal and right hilar lymph nodes. Electronically Signed   By: Narda Rutherford M.D.   On: 03/07/2023 20:39   DG Chest 2 View  Result Date: 03/07/2023 CLINICAL DATA:  Cough for 2 days with shortness of breath EXAM: CHEST - 2 VIEW COMPARISON:  X-ray 12/22/2021 FINDINGS: Under penetrated radiographs. No consolidation, pneumothorax or effusion. No edema. Normal cardiopericardial silhouette. Calcified lymph nodes in the right lung hilum consistent with old granulomatous disease. Degenerative changes are seen along the spine on the lateral view. Overlapping cardiac leads. IMPRESSION: Limited radiographs.  No acute cardiopulmonary disease. Electronically Signed   By: Karen Kays M.D.   On: 03/07/2023 19:24    Pertinent labs & imaging results that were available during my care of the patient were reviewed by me and considered in my medical decision making (see MDM for details).  Medications Ordered in ED Medications  lidocaine (LIDODERM) 5 % 1 patch (1 patch  Transdermal Patch Applied 03/07/23 2029)  oxyCODONE-acetaminophen (PERCOCET/ROXICET) 5-325 MG per tablet 1 tablet (has no administration in time range)  ipratropium-albuterol (DUONEB) 0.5-2.5 (3) MG/3ML nebulizer solution 3 mL (3 mLs Nebulization Given 03/07/23 2028)  ketorolac (TORADOL) 15 MG/ML injection 15 mg (15 mg Intravenous Given 03/07/23 2027)  sodium chloride 0.9 % bolus 1,000 mL (1,000 mLs Intravenous Bolus 03/07/23 2029)  potassium chloride SA (KLOR-CON M) CR tablet 40 mEq (40 mEq Oral Given 03/07/23 2027)  iohexol (OMNIPAQUE) 350 MG/ML injection 80 mL (100 mLs Intravenous Contrast Given 03/07/23 2016)  methylPREDNISolone sodium succinate (SOLU-MEDROL) 125 mg/2 mL injection 62.5 mg (62.5 mg Intravenous Given 03/07/23 2217)  ipratropium-albuterol (DUONEB) 0.5-2.5 (3) MG/3ML nebulizer solution 3 mL (3 mLs Nebulization Given 03/07/23 2152)  HYDROmorphone (DILAUDID) injection 1 mg (1 mg Intravenous Given 03/07/23 2217)  ondansetron (ZOFRAN) injection 4 mg (4 mg Intravenous Given 03/07/23 2217)  Procedures Procedures  (including critical care time)  Medical Decision Making / ED Course    Medical Decision Making:    Eleanor Gatliff is a 57 y.o. female with past medical history as below, significant for asthma, depression, GERD, obesity who presents to the ED with complaint of chest pain, dyspnea, cough.. The complaint involves an extensive differential diagnosis and also carries with it a high risk of complications and morbidity.  Serious etiology was considered. Ddx includes but is not limited to: In my evaluation of this patient's dyspnea my DDx includes, but is not limited to, pneumonia, pulmonary embolism, pneumothorax, pulmonary edema, metabolic acidosis, asthma, COPD, cardiac cause, anemia, anxiety, etc.  Differential includes all life-threatening causes  for chest pain. This includes but is not exclusive to acute coronary syndrome, aortic dissection, pulmonary embolism, cardiac tamponade, community-acquired pneumonia, pericarditis, musculoskeletal chest wall pain, etc.   Complete initial physical exam performed, notably the patient  was tachypnea noted, not hypoxic. Pleuritic chest pain.    Reviewed and confirmed nursing documentation for past medical history, family history, social history.  Vital signs reviewed.    Clinical Course as of 03/07/23 2330  Wed Mar 07, 2023  2315 Feeling better on recheck, no hypoxia.  CT imaging reviewed, no pneumonia or PE. [SG]    Clinical Course User Index [SG] Sloan Leiter, DO   PERC score does not apply CXR clear Will give duoneb/analgesia/CT  Labs reviewed, she has leukocytosis 12.5, she is afebrile, she is not tachycardic..  This is likely stress response in setting of asthma exacerbation Potassium is mild reduced, replaced orally Troponin is negative x 2, BNP is not elevated.  EKG stable.  Pain is primarily to her right back, intercostal muscles.  Appears atypical in nature.  ACS seems unlikely. Patient with likely asthma exacerbation and concurrent costochondritis. She is feeling much better after nebulized breathing treatments and steroids.  Analgesics have improved her back pain/intercostal pain. Discussed with patient and spouse at bedside regarding findings today in outpatient care plan.  Concern for asthma exacerbation.  Given prescription for antitussive, steroids, bronchodilators, analgesics for her likely costochondritis.  Patient is requesting oral antibiotics. Wheezing improved, not requiring supplemental oxygen She is feeling much better, stable for discharge.  Not hypoxic.   The patient improved significantly and was discharged in stable condition. Detailed discussions were had with the patient regarding current findings, and need for close f/u with PCP or on call doctor. The patient  has been instructed to return immediately if the symptoms worsen in any way for re-evaluation. Patient verbalized understanding and is in agreement with current care plan. All questions answered prior to discharge.    Additional history obtained: -Additional history obtained from spouse -External records from outside source obtained and reviewed including: Chart review including previous notes, labs, imaging, consultation notes including primary care documentation, home medications, prior labs and imaging   Lab Tests: -I ordered, reviewed, and interpreted labs.   The pertinent results include:   Labs Reviewed  BASIC METABOLIC PANEL - Abnormal; Notable for the following components:      Result Value   Potassium 3.3 (*)    CO2 20 (*)    All other components within normal limits  CBC WITH DIFFERENTIAL/PLATELET - Abnormal; Notable for the following components:   WBC 12.5 (*)    Neutro Abs 9.2 (*)    All other components within normal limits  RESP PANEL BY RT-PCR (RSV, FLU A&B, COVID)  RVPGX2  BRAIN NATRIURETIC PEPTIDE  TROPONIN  I (HIGH SENSITIVITY)  TROPONIN I (HIGH SENSITIVITY)    Notable for as above, stable  EKG   EKG Interpretation  Date/Time:  Wednesday March 07 2023 18:38:49 EDT Ventricular Rate:  76 PR Interval:  174 QRS Duration: 90 QT Interval:  471 QTC Calculation: 530 R Axis:   67 Text Interpretation: Sinus rhythm Low voltage, precordial leads Prolonged QT interval no stemi Confirmed by Tanda Rockers ([id number]) on 03/07/2023 7:31:17 PM         Imaging Studies ordered: I ordered imaging studies including CXR CTPE I independently visualized the following imaging with scope of interpretation limited to determining acute life threatening conditions related to emergency care; findings noted above, significant for small airway dz, no pna or PE I independently visualized and interpreted imaging. I agree with the radiologist interpretation   Medicines ordered and  prescription drug management: Meds ordered this encounter  Medications   ipratropium-albuterol (DUONEB) 0.5-2.5 (3) MG/3ML nebulizer solution 3 mL   ketorolac (TORADOL) 15 MG/ML injection 15 mg   lidocaine (LIDODERM) 5 % 1 patch   sodium chloride 0.9 % bolus 1,000 mL   potassium chloride SA (KLOR-CON M) CR tablet 40 mEq   iohexol (OMNIPAQUE) 350 MG/ML injection 80 mL   methylPREDNISolone sodium succinate (SOLU-MEDROL) 125 mg/2 mL injection 62.5 mg   ipratropium-albuterol (DUONEB) 0.5-2.5 (3) MG/3ML nebulizer solution 3 mL   HYDROmorphone (DILAUDID) injection 1 mg   ondansetron (ZOFRAN) injection 4 mg   oxyCODONE-acetaminophen (PERCOCET/ROXICET) 5-325 MG per tablet 1 tablet   oxyCODONE (ROXICODONE) 5 MG immediate release tablet    Sig: Take 1 tablet (5 mg total) by mouth every 4 (four) hours as needed for severe pain.    Dispense:  8 tablet    Refill:  0   predniSONE (DELTASONE) 50 MG tablet    Sig: Take 1 tablet (50 mg total) by mouth daily for 5 days.    Dispense:  5 tablet    Refill:  0   albuterol (VENTOLIN HFA) 108 (90 Base) MCG/ACT inhaler    Sig: Inhale 1-2 puffs into the lungs every 4 (four) hours as needed for wheezing or shortness of breath.    Dispense:  1 each    Refill:  0   guaiFENesin-dextromethorphan (ROBITUSSIN DM) 100-10 MG/5ML syrup    Sig: Take 5 mLs by mouth every 4 (four) hours as needed for cough.    Dispense:  118 mL    Refill:  0   oxymetazoline (AFRIN NASAL SPRAY) 0.05 % nasal spray    Sig: Place 1 spray into both nostrils 2 (two) times daily for 3 days.    Dispense:  15 mL    Refill:  0   cetirizine (ZYRTEC ALLERGY) 10 MG tablet    Sig: Take 1 tablet (10 mg total) by mouth daily for 14 days.    Dispense:  14 tablet    Refill:  0   azithromycin (ZITHROMAX) 250 MG tablet    Sig: Take 1 tablet (250 mg total) by mouth daily. Take first 2 tablets together, then 1 every day until finished.    Dispense:  6 tablet    Refill:  0   acetaminophen (TYLENOL) 325  MG tablet    Sig: Take 2 tablets (650 mg total) by mouth every 6 (six) hours as needed.    Dispense:  36 tablet    Refill:  0    -I have reviewed the patients home medicines and have made adjustments as needed   Consultations Obtained:  na   Cardiac Monitoring: The patient was maintained on a cardiac monitor.  I personally viewed and interpreted the cardiac monitored which showed an underlying rhythm of: NSR  Social Determinants of Health:  Diagnosis or treatment significantly limited by social determinants of health: current smoker and obesity Counseled patient for approximately 3 minutes regarding smoking cessation. Discussed risks of smoking and how they applied and affected their visit here today. Vaping cessation. Patient not ready to quit at this time, however will follow up with their primary doctor when they are.   CPT code: 16109: intermediate counseling for smoking cessation     Reevaluation: After the interventions noted above, I reevaluated the patient and found that they have improved  Co morbidities that complicate the patient evaluation  Past Medical History:  Diagnosis Date   Allergy    grass    Anemia    Anxiety    Arthritis    knees x 2   and hip on left    Asthma    none since moved to [place]    Depression    GERD (gastroesophageal reflux disease)    PRn Rolaids -  gets with red sauces etc    Heart murmur    as baby    History of substance abuse    Insomnia    Morbid obesity    Neuromuscular disorder    plantar fascitis    Substance abuse    Thyroid disease    currently no meds       Dispostion: Disposition decision including need for hospitalization was considered, and patient discharged from emergency department.    Final Clinical Impression(s) / ED Diagnoses Final diagnoses:  Exacerbation of intermittent asthma, unspecified asthma severity  Acute cough  Costochondritis     This chart was dictated using voice recognition software.   Despite best efforts to proofread,  errors can occur which can change the documentation meaning.    Sloan Leiter, DO 03/07/23 2330

## 2023-03-14 ENCOUNTER — Ambulatory Visit: Payer: 59 | Admitting: Internal Medicine

## 2023-03-14 ENCOUNTER — Encounter: Payer: Self-pay | Admitting: Internal Medicine

## 2023-03-14 VITALS — BP 110/70 | HR 65 | Temp 97.7°F | Wt 296.3 lb

## 2023-03-14 DIAGNOSIS — M549 Dorsalgia, unspecified: Secondary | ICD-10-CM

## 2023-03-14 DIAGNOSIS — E559 Vitamin D deficiency, unspecified: Secondary | ICD-10-CM

## 2023-03-14 DIAGNOSIS — G932 Benign intracranial hypertension: Secondary | ICD-10-CM | POA: Diagnosis not present

## 2023-03-14 DIAGNOSIS — Z09 Encounter for follow-up examination after completed treatment for conditions other than malignant neoplasm: Secondary | ICD-10-CM

## 2023-03-14 MED ORDER — CYCLOBENZAPRINE HCL 5 MG PO TABS
5.0000 mg | ORAL_TABLET | Freq: Every evening | ORAL | 1 refills | Status: DC | PRN
Start: 2023-03-14 — End: 2023-03-20

## 2023-03-14 MED ORDER — MELOXICAM 15 MG PO TABS
15.0000 mg | ORAL_TABLET | Freq: Every day | ORAL | 0 refills | Status: DC
Start: 2023-03-14 — End: 2023-04-10

## 2023-03-14 NOTE — Progress Notes (Signed)
Established Patient Office Visit     CC/Reason for Visit: Hospital discharge follow-up, discuss acute concerns.  HPI: Jill Davis is a 57 y.o. female who is coming in today for the above mentioned reasons. Past Medical History is significant for: Morbid obesity, pseudotumor cerebri, vitamin D deficiency, insomnia.  She had COVID in February.  She was seen in the ED on 17 April with shortness of breath and cough, CT angiogram was negative for acute findings, she was thought to have an asthma exacerbation.  She was given steroids nebulizer treatments and antibiotics and has improved.  She has since developed severe pain of her right upper back underneath her shoulder blade.   Past Medical/Surgical History: Past Medical History:  Diagnosis Date   Allergy    grass    Anemia    Anxiety    Arthritis    knees x 2   and hip on left    Asthma    none since moved to [place]    Depression    GERD (gastroesophageal reflux disease)    PRn Rolaids -  gets with red sauces etc    Heart murmur    as baby    History of substance abuse    Insomnia    Morbid obesity    Neuromuscular disorder    plantar fascitis    Substance abuse    Thyroid disease    currently no meds     Past Surgical History:  Procedure Laterality Date   ABDOMINAL HYSTERECTOMY     BREAST BIOPSY Left 11/21/2015   benign   COLONOSCOPY WITH PROPOFOL N/A 10/05/2020   Procedure: COLONOSCOPY WITH PROPOFOL;  Surgeon: Tressia Danas, MD;  Location: [facility];  Service: Gastroenterology;  Laterality: N/A;   cyst on buttocks      PARTIAL HYSTERECTOMY     prior to abd full hysterectomy   POLYPECTOMY  10/05/2020   Procedure: POLYPECTOMY;  Surgeon: Tressia Danas, MD;  Location: [facility];  Service: Gastroenterology;;    Social History:  reports that she has quit smoking. She has never used smokeless tobacco. She reports that she does not currently use alcohol. She reports that she does not currently use  drugs.  Allergies: Allergies  Allergen Reactions   Celebrex [Celecoxib] Itching    Family History:  Family History  Problem Relation Age of Onset   Diabetes Mother    Hypertension Mother    Sleep apnea Mother    CAD Father    Hypertension Father    Sleep apnea Father    Breast cancer Sister        53s   Sleep apnea Sister    Hypertension Brother    Colon cancer Neg Hx    Colon polyps Neg Hx    Esophageal cancer Neg Hx    Stomach cancer Neg Hx    Rectal cancer Neg Hx      Current Outpatient Medications:    acetaminophen (TYLENOL) 325 MG tablet, Take 2 tablets (650 mg total) by mouth every 6 (six) hours as needed., Disp: 36 tablet, Rfl: 0   albuterol (VENTOLIN HFA) 108 (90 Base) MCG/ACT inhaler, Inhale 2 puffs into the lungs every 6 (six) hours as needed for wheezing or shortness of breath., Disp: 8 g, Rfl: 2   azithromycin (ZITHROMAX) 250 MG tablet, Take 1 tablet (250 mg total) by mouth daily. Take first 2 tablets together, then 1 every day until finished., Disp: 6 tablet, Rfl: 0   buPROPion (WELLBUTRIN XL)  150 MG 24 hr tablet, TAKE 1 TABLET BY MOUTH EVERY DAY, Disp: 90 tablet, Rfl: 1   Calcium Carb-Cholecalciferol (CALCIUM 600/VITAMIN D PO), Take by mouth., Disp: , Rfl:    cetirizine (ZYRTEC ALLERGY) 10 MG tablet, Take 1 tablet (10 mg total) by mouth daily for 14 days., Disp: 14 tablet, Rfl: 0   cyclobenzaprine (FLEXERIL) 5 MG tablet, Take 1 tablet (5 mg total) by mouth at bedtime as needed for muscle spasms., Disp: 30 tablet, Rfl: 1   diphenhydramine-acetaminophen (TYLENOL PM) 25-500 MG TABS tablet, Take 2 tablets by mouth at bedtime as needed (sleep.)., Disp: , Rfl:    ibuprofen (ADVIL) 200 MG tablet, Take 1,000 mg by mouth every 8 (eight) hours as needed (for pain.). , Disp: , Rfl:    meloxicam (MOBIC) 15 MG tablet, Take 1 tablet (15 mg total) by mouth daily., Disp: 30 tablet, Rfl: 0   ofloxacin (OCUFLOX) 0.3 % ophthalmic solution, Place 1 drop into the left eye 4 (four)  times daily., Disp: 5 mL, Rfl: 0   oxyCODONE (ROXICODONE) 5 MG immediate release tablet, Take 1 tablet (5 mg total) by mouth every 4 (four) hours as needed for severe pain., Disp: 8 tablet, Rfl: 0   triamcinolone cream (KENALOG) 0.1 %, Apply 1 application topically 2 (two) times daily., Disp: 30 g, Rfl: 0   zolpidem (AMBIEN) 10 MG tablet, TAKE 1 TABLET BY MOUTH EVERY DAY AT BEDTIME AS NEEDED FOR SLEEP, Disp: 15 tablet, Rfl: 1   zolpidem (AMBIEN) 5 MG tablet, Take 5 mg by mouth at bedtime., Disp: , Rfl:    zonisamide (ZONEGRAN) 50 MG capsule, Take 1 capsule (50 mg total) by mouth in the morning and at bedtime., Disp: 60 capsule, Rfl: 5   albuterol (VENTOLIN HFA) 108 (90 Base) MCG/ACT inhaler, Inhale 1-2 puffs into the lungs every 4 (four) hours as needed for wheezing or shortness of breath. (Patient not taking: Reported on 03/14/2023), Disp: 1 each, Rfl: 0  Review of Systems:  Negative unless indicated in HPI.   Physical Exam: Vitals:   03/14/23 1447  BP: 110/70  Pulse: 65  Temp: 97.7 F (36.5 C)  TempSrc: Oral  SpO2: 97%  Weight: 296 lb 4.8 oz (134.4 kg)    Body mass index is 50.86 kg/m.   Physical Exam Vitals reviewed.  Constitutional:      Appearance: Normal appearance.  HENT:     Head: Normocephalic and atraumatic.  Eyes:     Conjunctiva/sclera: Conjunctivae normal.     Pupils: Pupils are equal, round, and reactive to light.  Skin:    General: Skin is warm and dry.  Neurological:     General: No focal deficit present.     Mental Status: She is alert and oriented to person, place, and time.  Psychiatric:        Mood and Affect: Mood normal.        Behavior: Behavior normal.        Thought Content: Thought content normal.        Judgment: Judgment normal.      Impression and Plan:  Upper back pain on right side - Plan: meloxicam (MOBIC) 15 MG tablet, cyclobenzaprine (FLEXERIL) 5 MG tablet  Morbid obesity  Vitamin D deficiency  Pseudotumor cerebri  Hospital  discharge follow-up  [facility] charts reviewed in detail. -Suspect this is simply musculoskeletal pain given lack of red flag signs/symptoms. -Advised icing, as needed NSAIDs, back stretches, local massage therapy, muscle relaxers, local massage therapy. -If no  improvement in 3 to 4 weeks, can consider referral to physical therapy. -Prescription for meloxicam and Flexeril sent today.   Time spent:32 minutes reviewing chart, interviewing and examining patient and formulating plan of care.     Chaya Jan, MD [facility]

## 2023-03-15 ENCOUNTER — Encounter: Payer: Self-pay | Admitting: Internal Medicine

## 2023-03-20 ENCOUNTER — Other Ambulatory Visit: Payer: Self-pay | Admitting: Internal Medicine

## 2023-03-20 DIAGNOSIS — M549 Dorsalgia, unspecified: Secondary | ICD-10-CM

## 2023-03-20 MED ORDER — TIZANIDINE HCL 4 MG PO TABS
4.0000 mg | ORAL_TABLET | Freq: Three times a day (TID) | ORAL | 0 refills | Status: DC | PRN
Start: 2023-03-20 — End: 2023-09-12

## 2023-03-23 ENCOUNTER — Telehealth: Payer: 59 | Admitting: Physician Assistant

## 2023-03-23 DIAGNOSIS — B370 Candidal stomatitis: Secondary | ICD-10-CM

## 2023-03-23 MED ORDER — NYSTATIN 100000 UNIT/ML MT SUSP: 5.0000 mL | Freq: Four times a day (QID) | OROMUCOSAL | 0 refills | Status: DC

## 2023-03-23 NOTE — Progress Notes (Signed)
E-Visit for Mouth Ulcers/Oral Ginette Pitman  We are sorry that you are not feeling well.  Here is how we plan to help!  Based on what you have shared with me, it appears that you do have mouth ulcer(s).     The following medications should decrease the discomfort and help with healing.  I have prescribed nystatin mouth wash.  Please follow up with your Primary Care Provider or Urgent Care if no improvement.     While the exact causes are unknown, some common causes and factors that may aggravate mouth ulcers include: Genetics - Sometimes mouth ulcers run in families High alcohol intake Acidic foods such as citrus fruits like pineapple, grapefruit, orange fruits/juices, may aggravate mouth ulcers Other foods high in acidity or spice such as coffee, chocolate, chips, pretzels, eggs, nuts, cheese Quitting smoking Injury caused by biting the tongue or inside of the cheek Diet lacking in B-12, zinc, folic acid or iron Female hormone shifts with menstruation Excessive fatigue, emotional stress or anxiety Prevention: Talk to your doctor if you are taking meds that are known to cause mouth ulcers such as:   Anti-inflammatory drugs (for example Ibuprofen, Naproxen sodium), pain killers, Beta blockers, Oral nicotine replacement drugs, Some street drugs (heroin).   Avoid allowing any tablets to dissolve in your mouth that are meant to swallowed whole Avoid foods/drinks that trigger or worsen symptoms Keep your mouth clean with daily brushing and flossing  Home Care: The goal with treatment is to ease the pain where ulcers occur and help them heal as quickly as possible.  There is no medical treatment to prevent mouth ulcers from coming back or recurring.  Avoid spicy and acidic foods Eat soft foods and avoid rough, crunchy foods Avoid chewing gum Do not use toothpaste that contains sodium lauryl sulphite Use a straw to drink which helps avoid liquids toughing the ulcers near the front of your  mouth Use a very soft toothbrush If you have dentures or dental hardware that you feel is not fitting well or contributing to his, please see your dentist. Use saltwater mouthwash which helps healing. Dissolve a  teaspoon of salt in a glass of warm water. Swish around your mouth and spit it out. This can be used as needed if it is soothing.   GET HELP RIGHT AWAY IF: Persistent ulcers require checking IN PERSON (face to face). Any mouth lesion lasting longer than a month should be seen by your DENTIST as soon as possible for evaluation for possible oral cancer. If you have a non-painful ulcer in 1 or more areas of your mouth Ulcers that are spreading, are very large or particularly painful Ulcers last longer than one week without improving on treatment If you develop a fever, swollen glands and begin to feel unwell Ulcers that developed after starting a new medication MAKE SURE YOU: Understand these instructions. Will watch your condition. Will get help right away if you are not doing well or get worse.  Thank you for choosing an e-visit.  Your e-visit answers were reviewed by a board certified advanced clinical practitioner to complete your personal care plan. Depending upon the condition, your plan could have included both over the counter or prescription medications.  Please review your pharmacy choice. Make sure the pharmacy is open so you can pick up prescription now. If there is a problem, you may contact your provider through [employer] and have the prescription routed to another pharmacy.  Your safety is important to [place]. If  you have drug allergies check your prescription carefully.   For the next 24 hours you can use MyChart to ask questions about today's visit, request a non-urgent call back, or ask for a work or school excuse. You will get an email in the next two days asking about your experience. I hope that your e-visit has been valuable and will speed your recovery.   I  have spent 5 minutes in review of e-visit questionnaire, review and updating patient chart, medical decision making and response to patient.   Tylene Fantasia Ward, PA-C

## 2023-03-26 ENCOUNTER — Other Ambulatory Visit: Payer: Self-pay | Admitting: Internal Medicine

## 2023-03-26 DIAGNOSIS — B370 Candidal stomatitis: Secondary | ICD-10-CM

## 2023-03-26 MED ORDER — NYSTATIN 100000 UNIT/ML MT SUSP: 5.0000 mL | Freq: Four times a day (QID) | OROMUCOSAL | 0 refills | Status: DC

## 2023-03-29 NOTE — Therapy (Signed)
OUTPATIENT PHYSICAL THERAPY THORACOLUMBAR EVALUATION   Patient Name: Jill Davis MRN: [id number] DOB:07/15/1966, 57 y.o., female Today's Date: 03/29/2023  END OF SESSION:   Past Medical History:  Diagnosis Date   Allergy    grass    Anemia    Anxiety    Arthritis    knees x 2   and hip on left    Asthma    none since moved to [place]    Depression    GERD (gastroesophageal reflux disease)    PRn Rolaids -  gets with red sauces etc    Heart murmur    as baby    History of substance abuse (HCC)    Insomnia    Morbid obesity (HCC)    Neuromuscular disorder (HCC)    plantar fascitis    Substance abuse (HCC)    Thyroid disease    currently no meds    Past Surgical History:  Procedure Laterality Date   ABDOMINAL HYSTERECTOMY     BREAST BIOPSY Left 11/21/2015   benign   COLONOSCOPY WITH PROPOFOL N/A 10/05/2020   Procedure: COLONOSCOPY WITH PROPOFOL;  Surgeon: Tressia Danas, MD;  Location: [facility];  Service: Gastroenterology;  Laterality: N/A;   cyst on buttocks      PARTIAL HYSTERECTOMY     prior to abd full hysterectomy   POLYPECTOMY  10/05/2020   Procedure: POLYPECTOMY;  Surgeon: Tressia Danas, MD;  Location: [facility];  Service: Gastroenterology;;   Patient Active Problem List   Diagnosis Date Noted   Polyp of descending colon    Vitamin D deficiency 07/08/2020   Morbid obesity (HCC)    Depression    Insomnia    History of substance abuse (HCC)     PCP: Philip Aspen, Limmie Patricia, MD   REFERRING PROVIDER: Philip Aspen, Limmie Patricia, MD   REFERRING DIAG: M54.9 (ICD-10-CM) - Upper back pain on right side   Rationale for Evaluation and Treatment: Rehabilitation  THERAPY DIAG:  No diagnosis found.  ONSET DATE: 3-4 weeks  SUBJECTIVE:                                                                                                                                                                                           SUBJECTIVE  STATEMENT: I'm experiencing severe pain under by R shoulder blade. It takes my breath away. I've ordered a TENS unit. The pain is not at as high of an intensity as often. Pt describes an insidious onset. Pt went to the ED on 4/17 due to the pain.  PERTINENT HISTORY:  High BMI, anxiety, depression  PAIN:  Are you having pain? Yes:  NPRS scale: 7/10 Pain location: Under R shoulder blade Pain description: Sharp, ache, constant  Aggravating factors: Lying down, prolonged sitting, turning to the R  Relieving factors: Stretches Pain range: 5-10/10  PRECAUTIONS: None  WEIGHT BEARING RESTRICTIONS: No  FALLS:  Has patient fallen in last 6 months? 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 Mild thoracic spondylosis with spurring. No musculoskeletal findings to account for pain.   Review of the MIP images confirms the above findings.   IMPRESSION: 1. No pulmonary embolus. 2. Mild heterogeneity of pulmonary parenchyma, can be seen with small airways disease. 3. Sequela of prior granulomatous disease with calcified granuloma in the right lower lobe and calcified mediastinal and right hilar lymph nodes.  PATIENT SURVEYS:  FOTO: Perceived function   %, predicted   %   SCREENING FOR RED FLAGS: Bowel or bladder incontinence: No Spinal tumors: No Cauda equina syndrome: No Compression fracture: No   COGNITION: Overall cognitive status: Within functional limits for tasks  assessed     SENSATION: WFL  MUSCLE LENGTH: Hamstrings: Right *** deg; Left *** deg Maisie Fus test: Right *** deg; Left *** deg  POSTURE: {posture:25561}  PALPATION: ***  LUMBAR ROM:   AROM eval  Flexion   Extension   Right lateral flexion   Left lateral flexion   Right rotation   Left rotation    (Blank rows = not tested)  LOWER EXTREMITY ROM:     {AROM/PROM:27142}  Right eval Left eval  Hip flexion    Hip extension    Hip abduction    Hip adduction    Hip internal rotation    Hip external rotation    Knee flexion    Knee extension    Ankle dorsiflexion    Ankle plantarflexion    Ankle inversion    Ankle eversion     (Blank rows = not tested)  LOWER EXTREMITY MMT:    MMT Right eval Left eval  Hip flexion    Hip extension    Hip abduction    Hip adduction    Hip internal rotation    Hip external rotation    Knee flexion    Knee extension    Ankle dorsiflexion    Ankle plantarflexion    Ankle inversion    Ankle eversion     (Blank rows = not tested)  LUMBAR SPECIAL TESTS:  {lumbar special test:25242}  FUNCTIONAL TESTS:  {Functional tests:24029}  GAIT: Distance walked: *** Assistive device utilized: {Assistive devices:23999} Level of assistance: {Levels of assistance:24026} Comments: ***  TODAY'S TREATMENT:  [facility] Adult PT Treatment:                                                DATE: 03/30/23 Therapeutic Exercise: *** Manual Therapy: *** Neuromuscular re-ed: *** Therapeutic Activity: *** Modalities: *** Self Care: ***     PATIENT EDUCATION:  Education details: *** Person educated: {Person educated:25204} Education method: {Education Method:25205} Education comprehension: {Education Comprehension:25206}  HOME EXERCISE PROGRAM: Access Code: [email or web address] URL: [email or web address] Date:  03/30/2023 Prepared by: Joellyn Rued  Exercises - Seated Scapular Retraction  - 6 x daily - 7 x weekly - 1 sets - 3-5 reps - 5 hold - Shoulder Flexion Wall Slide with Towel  - 1-2 x daily - 7 x weekly - 1 sets - 10 reps - 5 hold - Standing Shoulder Row with Anchored Resistance  - 1-2 x daily - 7 x weekly - 2 sets - 10 reps - 3 hold  ASSESSMENT:  CLINICAL IMPRESSION: Patient is a 57 y.o. female  who was seen today for physical therapy evaluation and treatment for M54.9 (ICD-10-CM) - Upper back pain on right side .   OBJECTIVE IMPAIRMENTS: {opptimpairments:25111}.   ACTIVITY LIMITATIONS: {activitylimitations:27494}  PARTICIPATION LIMITATIONS: {participationrestrictions:25113}  PERSONAL FACTORS: {Personal factors:25162} are also affecting patient's functional outcome.   REHAB POTENTIAL: {rehabpotential:25112}  CLINICAL DECISION MAKING: {clinical decision making:25114}  EVALUATION COMPLEXITY: {Evaluation complexity:25115}   GOALS:  SHORT TERM GOALS: Target date: ***  *** Baseline: Goal status: {GOALSTATUS:25110}  2.  *** Baseline:  Goal status: {GOALSTATUS:25110}  3.  *** Baseline:  Goal status: {GOALSTATUS:25110}  4.  *** Baseline:  Goal status: {GOALSTATUS:25110}  5.  *** Baseline:  Goal status: {GOALSTATUS:25110}  6.  *** Baseline:  Goal status: {GOALSTATUS:25110}  LONG TERM GOALS: Target date: ***  *** Baseline:  Goal status: {GOALSTATUS:25110}  2.  *** Baseline:  Goal status: {GOALSTATUS:25110}  3.  *** Baseline:  Goal status: {GOALSTATUS:25110}  4.  *** Baseline:  Goal status: {GOALSTATUS:25110}  5.  *** Baseline:  Goal status: {GOALSTATUS:25110}  6.  *** Baseline:  Goal status: {GOALSTATUS:25110}  PLAN:  PT FREQUENCY: {rehab frequency:25116}  PT DURATION: {rehab duration:25117}  PLANNED INTERVENTIONS: {rehab planned interventions:25118::"Therapeutic exercises","Therapeutic activity","Neuromuscular re-education","Balance  training","Gait training","Patient/Family education","Self Care","Joint mobilization"}.  PLAN FOR NEXT SESSION: ***   Joellyn Rued, PT 03/29/2023, 9:34 AM

## 2023-03-30 ENCOUNTER — Other Ambulatory Visit: Payer: Self-pay

## 2023-03-30 ENCOUNTER — Ambulatory Visit: Payer: 59 | Attending: Internal Medicine

## 2023-03-30 DIAGNOSIS — M546 Pain in thoracic spine: Secondary | ICD-10-CM | POA: Insufficient documentation

## 2023-03-30 DIAGNOSIS — M549 Dorsalgia, unspecified: Secondary | ICD-10-CM | POA: Diagnosis not present

## 2023-03-30 DIAGNOSIS — M62830 Muscle spasm of back: Secondary | ICD-10-CM

## 2023-04-05 ENCOUNTER — Ambulatory Visit: Payer: 59

## 2023-04-10 ENCOUNTER — Other Ambulatory Visit: Payer: Self-pay | Admitting: Internal Medicine

## 2023-04-10 DIAGNOSIS — M549 Dorsalgia, unspecified: Secondary | ICD-10-CM

## 2023-04-10 NOTE — Therapy (Addendum)
OUTPATIENT PHYSICAL THERAPY TREATMENT NOTE/Discharge   Patient Name: Jill Davis MRN: [id number] DOB:[birth year], 57 y.o., female Today's Date: 04/11/2023  PCP: Philip Aspen, Limmie Patricia, MD   REFERRING PROVIDER: Philip Aspen, Limmie Patricia, MD   END OF SESSION:   PT End of Session - 04/11/23 4098     Visit Number 2    Number of Visits 13    Date for PT Re-Evaluation 05/18/23    Authorization Type [employer]ET[employer] NAP    PT Start Time 0719    PT Stop Time 0805    PT Time Calculation (min) 46 min    Activity Tolerance Patient tolerated treatment well    Behavior During Therapy WFL for tasks assessed/performed             Past Medical History:  Diagnosis Date   Allergy    grass    Anemia    Anxiety    Arthritis    knees x 2   and hip on left    Asthma    none since moved to [place]    Depression    GERD (gastroesophageal reflux disease)    PRn Rolaids -  gets with red sauces etc    Heart murmur    as baby    History of substance abuse (HCC)    Insomnia    Morbid obesity (HCC)    Neuromuscular disorder (HCC)    plantar fascitis    Substance abuse (HCC)    Thyroid disease    currently no meds    Past Surgical History:  Procedure Laterality Date   ABDOMINAL HYSTERECTOMY     BREAST BIOPSY Left 11/21/2015   benign   COLONOSCOPY WITH PROPOFOL N/A 10/05/2020   Procedure: COLONOSCOPY WITH PROPOFOL;  Surgeon: Tressia Danas, MD;  Location: [facility];  Service: Gastroenterology;  Laterality: N/A;   cyst on buttocks      PARTIAL HYSTERECTOMY     prior to abd full hysterectomy   POLYPECTOMY  10/05/2020   Procedure: POLYPECTOMY;  Surgeon: Tressia Danas, MD;  Location: [facility];  Service: Gastroenterology;;   Patient Active Problem List   Diagnosis Date Noted   Polyp of descending colon    Vitamin D deficiency 07/08/2020   Morbid obesity (HCC)    Depression    Insomnia    History of substance abuse (HCC)     REFERRING DIAG: M54.9 (ICD-10-CM) - Upper  back pain on right side   THERAPY DIAG:  Pain in thoracic spine  Muscle spasm of back  Rationale for Evaluation and Treatment Rehabilitation  ONSET DATE: 3-4 weeks   SUBJECTIVE:  SUBJECTIVE STATEMENT: Walking more seemed to to provide some relief, but last night she had a flare up. Overall, pt thinks she is making some improvement.   PAIN:  Are you having pain? Yes: NPRS scale: 7/10 Pain location: Under R shoulder blade Pain description: Sharp, ache, constant  Aggravating factors: Lying down, prolonged sitting, turning to the R  Relieving factors: Stretches Pain range: 5-10/10  PERTINENT HISTORY:  High BMI, anxiety, depression   PRECAUTIONS: None   WEIGHT BEARING RESTRICTIONS: No   FALLS:  Has patient fallen in last 6 months? 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 Mild thoracic spondylosis with spurring. No musculoskeletal findings to account for pain.   Review of the MIP images confirms the above findings.   IMPRESSION: 1. No pulmonary embolus. 2. Mild heterogeneity of pulmonary parenchyma, can be seen with small airways disease. 3. Sequela of prior  granulomatous disease with calcified granuloma in the right lower lobe and calcified mediastinal and right hilar lymph nodes.   PATIENT SURVEYS:  FOTO: Perceived function   42%, predicted   57%    SCREENING FOR RED FLAGS: Bowel or bladder incontinence: No Spinal tumors: No Cauda equina syndrome: No Compression fracture: No     COGNITION: Overall cognitive status: Within functional limits for tasks assessed                          SENSATION: WFL   MUSCLE LENGTH: Hamstrings: Right NT deg; Left NT deg Maisie Fus test: Right NT deg; Left NT deg   POSTURE: rounded shoulders and forward head, CT step off   PALPATION: Extremely TTP R mid back extending laterally to the area around the distal scapula   Thoracic ROM:    AROM eval  Flexion Markedly limited c p  Extension Markedly limited c p  Right lateral flexion Markedly limited c p  Left lateral flexion Markedly limited c p  Right rotation Markedly limited c p  Left rotation Markedly limited c p   P= concordant pain   UPPER EXTREMITY ROM:    All R shoulder motions were limited in AROM by concordant pain Active  Right eval Left eval  Hip flexion      Hip extension      Hip abduction      Hip adduction      Hip internal rotation      Hip external rotation      Knee flexion      Knee extension      Ankle dorsiflexion      Ankle plantarflexion      Ankle inversion      Ankle eversion       (Blank rows = not tested)   LOWER EXTREMITY MMT:   All R shoulder MMTs were limited by concordant pain MMT Right eval Left eval  Hip flexion  Hip extension      Hip abduction      Hip adduction      Hip internal rotation      Hip external rotation      Knee flexion      Knee extension      Ankle dorsiflexion      Ankle plantarflexion      Ankle inversion      Ankle eversion       (Blank rows = not tested)   GAIT: Distance walked: 200' Assistive device utilized: None Level of assistance: Complete  Independence Comments: Decreased pace   TODAY'S TREATMENT:  [facility] Adult PT Treatment:                                                DATE: 04/11/23 Therapeutic Exercise: R shoulder flexion on wall c deep breathes x10 Scapular retractions x10 R serratus punch x10 RTB R shoulder row x10 RTB Manual Therapy: STN/DTM to the R mid back paraspinals and interscapular muscles Modalities: IFC/premod to R midback scapular area x15 mins, intensity as tolerated Self Care: Instruction for use of a theracane and tennis ball for self massage. Pt returned demonstration                                                                                                                             [facility] Adult PT Treatment:                                                DATE: 03/30/23 Therapeutic Exercise: Developed, instructed in, and pt completed therex as noted in HEP  Self Care: Use of cold pack x 10 mins, sleeping positions for support and comfort   PATIENT EDUCATION:  Education details: Eval findings, POC, HEP, self care  Person educated: Patient Education method: Explanation, Demonstration, Tactile cues, Verbal cues, and Handouts Education comprehension: verbalized understanding, returned demonstration, verbal cues required, and tactile cues required   HOME EXERCISE PROGRAM: Access Code: [email or web address] URL: [email or web address] Date: 03/30/2023 Prepared by: Joellyn Rued   Exercises - Seated Scapular Retraction  - 6 x daily - 7 x weekly - 1 sets - 3-5 reps - 5 hold - Shoulder Flexion Wall Slide with Towel  - 1-2 x daily - 7 x weekly - 1 sets - 10 reps - 5 hold - Standing Shoulder Row with Anchored Resistance  - 1-2 x daily - 7 x weekly - 2 sets - 10 reps - 3 hold   ASSESSMENT:   CLINICAL IMPRESSION: PT was provided for STM/DTM to the R midback paraspinals and interscapular muscles. Pin pointing the area of pain was limited by the pt's body habitus. Therex was completed  for R mid back and  shoulder mobility and scapular strengthening. ICF/premod estim was then provided to the area of pain. Additionally, instruction for self care using a theracane and tennis ball, and a TENs was provided. Pt returned demonstration for the theracane and tennis ball. Pt tolerated PT today without adverse effects. Pt will continue to benefit from skilled PT to address impairments for improved function with less pain.     OBJECTIVE IMPAIRMENTS: decreased activity tolerance, decreased mobility, decreased ROM, decreased strength, increased muscle spasms, impaired UE functional use, postural dysfunction, obesity, and pain.    ACTIVITY LIMITATIONS: carrying, lifting, bending, sitting, standing, squatting, sleeping, bathing, toileting, dressing, reach over head, locomotion level, and caring for others   PARTICIPATION LIMITATIONS: meal prep, cleaning, laundry, driving, shopping, community activity, and occupation   PERSONAL FACTORS: Fitness, Past/current experiences, Time since onset of injury/illness/exacerbation, and 3+ comorbidities: High BMI, anxiety, depression  are also affecting patient's functional outcome.    REHAB POTENTIAL: Good   CLINICAL DECISION MAKING: Stable/uncomplicated   EVALUATION COMPLEXITY: Low     GOALS:   SHORT TERM GOALS: Target date: 04/20/23   Pt will be Ind in an initial HEP  Baseline: started Goal status: INITIAL   2.  Pt will voice understanding of measures to assist in pain reduction  Baseline: started Goal status: INITIAL   LONG TERM GOALS: Target date: 05/18/23   Pt will be Ind in a final HEP to maintain achieved LOF Baseline:  Goal status: INITIAL   2.  Pt will report 50% or greater improvement in her pain with daily activities for improved function and QOL Baseline: 5-10/10 Goal status: INITIAL   3.  Pt will demonstrate improved trunk and R shoulder mobility with only minimal limitations Baseline: Marked limitation Goal status: INITIAL   4.  Pt's FOTO  score will improved to the predicted value of 57% as indication of improved function  Baseline: 42% Goal status: INITIAL   PLAN:   PT FREQUENCY: 2x/week   PT DURATION: 6 weeks   PLANNED INTERVENTIONS: Therapeutic exercises, Therapeutic activity, Neuromuscular re-education, Patient/Family education, Self Care, Joint mobilization, Aquatic Therapy, Dry Needling, Electrical stimulation, Cryotherapy, Moist heat, Taping, Traction, Ultrasound, Ionotophoresis 4mg /ml Dexamethasone, Manual therapy, and Re-evaluation.   PLAN FOR NEXT SESSION: Review FOTO; assess response to HEP; progress therex as indicated; use of modalities, manual therapy; and TPDN as indicated.  Avaiah Stempel MS, PT 04/11/23 8:32 AM  PHYSICAL THERAPY DISCHARGE SUMMARY  Visits from Start of Care: 2  Current functional level related to goals / functional outcomes: Pt called cancelling future appts stating her pain was better   Remaining deficits: Unknown   Education / Equipment: HEP and Pt Ed   Patient agrees to discharge. Patient goals were partially met. Patient is being discharged due to being pleased with the current functional level.  Daichi Moris MS, PT 05/02/23 8:44 AM

## 2023-04-11 ENCOUNTER — Ambulatory Visit: Payer: 59

## 2023-04-11 ENCOUNTER — Ambulatory Visit: Payer: 59 | Admitting: Physical Therapy

## 2023-04-11 DIAGNOSIS — M546 Pain in thoracic spine: Secondary | ICD-10-CM

## 2023-04-11 DIAGNOSIS — M62830 Muscle spasm of back: Secondary | ICD-10-CM

## 2023-04-11 NOTE — Patient Instructions (Signed)

## 2023-04-25 ENCOUNTER — Other Ambulatory Visit: Payer: Self-pay | Admitting: Internal Medicine

## 2023-04-25 DIAGNOSIS — G4700 Insomnia, unspecified: Secondary | ICD-10-CM

## 2023-05-01 ENCOUNTER — Ambulatory Visit: Payer: 59

## 2023-05-15 ENCOUNTER — Ambulatory Visit: Payer: 59

## 2023-06-04 ENCOUNTER — Encounter: Payer: Self-pay | Admitting: Internal Medicine

## 2023-06-04 ENCOUNTER — Other Ambulatory Visit: Payer: Self-pay | Admitting: Internal Medicine

## 2023-06-04 DIAGNOSIS — B370 Candidal stomatitis: Secondary | ICD-10-CM

## 2023-06-04 MED ORDER — NYSTATIN 100000 UNIT/ML MT SUSP
5.0000 mL | Freq: Four times a day (QID) | OROMUCOSAL | 0 refills | Status: DC
Start: 2023-06-04 — End: 2024-07-23

## 2023-06-08 ENCOUNTER — Other Ambulatory Visit: Payer: Self-pay | Admitting: Adult Health

## 2023-06-11 NOTE — Telephone Encounter (Signed)
Can patient please be contacted to see what dose she is currently taking. Thank you.

## 2023-06-19 ENCOUNTER — Other Ambulatory Visit: Payer: Self-pay | Admitting: Internal Medicine

## 2023-06-19 ENCOUNTER — Encounter: Payer: Self-pay | Admitting: Internal Medicine

## 2023-06-19 DIAGNOSIS — M62838 Other muscle spasm: Secondary | ICD-10-CM

## 2023-06-19 MED ORDER — CYCLOBENZAPRINE HCL 5 MG PO TABS
5.0000 mg | ORAL_TABLET | Freq: Every evening | ORAL | 1 refills | Status: DC | PRN
Start: 2023-06-19 — End: 2023-10-01

## 2023-07-03 ENCOUNTER — Other Ambulatory Visit: Payer: Self-pay | Admitting: Internal Medicine

## 2023-07-03 DIAGNOSIS — G4700 Insomnia, unspecified: Secondary | ICD-10-CM

## 2023-07-04 ENCOUNTER — Other Ambulatory Visit: Payer: Self-pay | Admitting: Internal Medicine

## 2023-07-04 DIAGNOSIS — Z1231 Encounter for screening mammogram for malignant neoplasm of breast: Secondary | ICD-10-CM

## 2023-07-12 ENCOUNTER — Ambulatory Visit: Payer: 59 | Admitting: Neurology

## 2023-08-02 ENCOUNTER — Other Ambulatory Visit: Payer: Self-pay | Admitting: Internal Medicine

## 2023-08-02 DIAGNOSIS — F331 Major depressive disorder, recurrent, moderate: Secondary | ICD-10-CM

## 2023-08-05 ENCOUNTER — Other Ambulatory Visit: Payer: Self-pay | Admitting: Adult Health

## 2023-08-05 DIAGNOSIS — G4700 Insomnia, unspecified: Secondary | ICD-10-CM

## 2023-08-07 NOTE — Telephone Encounter (Signed)
Okay for refill?  

## 2023-08-10 ENCOUNTER — Ambulatory Visit
Admission: RE | Admit: 2023-08-10 | Discharge: 2023-08-10 | Disposition: A | Discharge status: Home / Self Care | Payer: 59 | Source: Ambulatory Visit

## 2023-08-10 DIAGNOSIS — Z1231 Encounter for screening mammogram for malignant neoplasm of breast: Secondary | ICD-10-CM

## 2023-08-30 ENCOUNTER — Other Ambulatory Visit: Payer: Self-pay | Admitting: Adult Health

## 2023-09-01 ENCOUNTER — Emergency Department (HOSPITAL_COMMUNITY): Payer: 59

## 2023-09-01 ENCOUNTER — Emergency Department (HOSPITAL_COMMUNITY)
Admission: EM | Admit: 2023-09-01 | Discharge: 2023-09-01 | Disposition: A | Discharge status: Home / Self Care | Payer: 59 | Attending: Emergency Medicine | Admitting: Emergency Medicine

## 2023-09-01 ENCOUNTER — Encounter (HOSPITAL_COMMUNITY): Payer: Self-pay

## 2023-09-01 DIAGNOSIS — K529 Noninfective gastroenteritis and colitis, unspecified: Secondary | ICD-10-CM | POA: Diagnosis not present

## 2023-09-01 DIAGNOSIS — J45909 Unspecified asthma, uncomplicated: Secondary | ICD-10-CM | POA: Insufficient documentation

## 2023-09-01 DIAGNOSIS — R109 Unspecified abdominal pain: Secondary | ICD-10-CM | POA: Diagnosis present

## 2023-09-01 LAB — CBC
HCT: 43.2 % (ref 36.0–46.0)
Hemoglobin: 13.7 g/dL (ref 12.0–15.0)
MCH: 27 pg (ref 26.0–34.0)
MCHC: 31.7 g/dL (ref 30.0–36.0)
MCV: 85 fL (ref 80.0–100.0)
Platelets: 273 10*3/uL (ref 150–400)
RBC: 5.08 MIL/uL (ref 3.87–5.11)
RDW: 15.3 % (ref 11.5–15.5)
WBC: 9.3 10*3/uL (ref 4.0–10.5)
nRBC: 0 % (ref 0.0–0.2)

## 2023-09-01 LAB — LIPASE, BLOOD: Lipase: 29 U/L (ref 11–51)

## 2023-09-01 LAB — I-STAT CHEM 8, ED
BUN: 6 mg/dL (ref 6–20)
Calcium, Ion: 1.14 mmol/L — ABNORMAL LOW (ref 1.15–1.40)
Chloride: 110 mmol/L (ref 98–111)
Creatinine, Ser: 0.8 mg/dL (ref 0.44–1.00)
Glucose, Bld: 102 mg/dL — ABNORMAL HIGH (ref 70–99)
HCT: 40 % (ref 36.0–46.0)
Hemoglobin: 13.6 g/dL (ref 12.0–15.0)
Potassium: 3.3 mmol/L — ABNORMAL LOW (ref 3.5–5.1)
Sodium: 143 mmol/L (ref 135–145)
TCO2: 19 mmol/L — ABNORMAL LOW (ref 22–32)

## 2023-09-01 LAB — COMPREHENSIVE METABOLIC PANEL
ALT: 27 U/L (ref 0–44)
AST: 27 U/L (ref 15–41)
Albumin: 3.5 g/dL (ref 3.5–5.0)
Alkaline Phosphatase: 101 U/L (ref 38–126)
Anion gap: 9 (ref 5–15)
BUN: 8 mg/dL (ref 6–20)
CO2: 20 mmol/L — ABNORMAL LOW (ref 22–32)
Calcium: 8.6 mg/dL — ABNORMAL LOW (ref 8.9–10.3)
Chloride: 109 mmol/L (ref 98–111)
Creatinine, Ser: 0.73 mg/dL (ref 0.44–1.00)
GFR, Estimated: >60 mL/min (ref >60)
Glucose, Bld: 99 mg/dL (ref 70–99)
Potassium: 3.2 mmol/L — ABNORMAL LOW (ref 3.5–5.1)
Sodium: 138 mmol/L (ref 135–145)
Total Bilirubin: 0.8 mg/dL (ref 0.3–1.2)
Total Protein: 7 g/dL (ref 6.5–8.1)

## 2023-09-01 LAB — URINALYSIS, ROUTINE W REFLEX MICROSCOPIC
Bilirubin Urine: NEGATIVE
Glucose, UA: NEGATIVE mg/dL
Hgb urine dipstick: NEGATIVE
Ketones, ur: NEGATIVE mg/dL
Leukocytes,Ua: NEGATIVE
Nitrite: NEGATIVE
Protein, ur: NEGATIVE mg/dL
Specific Gravity, Urine: 1.006 (ref 1.005–1.030)
pH: 7 (ref 5.0–8.0)

## 2023-09-01 MED ORDER — HYDROMORPHONE HCL 1 MG/ML IJ SOLN
1.0000 mg | Freq: Once | INTRAMUSCULAR | Status: AC
  Administered 2023-09-01: 1 mg via INTRAVENOUS
  Filled 2023-09-01: qty 1

## 2023-09-01 MED ORDER — DICYCLOMINE HCL 20 MG PO TABS: 20.0000 mg | ORAL_TABLET | Freq: Two times a day (BID) | ORAL | 0 refills | Status: DC

## 2023-09-01 MED ORDER — FLEET ENEMA RE ENEM: 1.0000 | ENEMA | Freq: Once | RECTAL | 0 refills | Status: AC

## 2023-09-01 MED ORDER — ONDANSETRON HCL 4 MG PO TABS: 4.0000 mg | ORAL_TABLET | Freq: Four times a day (QID) | ORAL | 0 refills | Status: DC | PRN

## 2023-09-01 MED ORDER — MORPHINE SULFATE (PF) 4 MG/ML IV SOLN
4.0000 mg | Freq: Once | INTRAVENOUS | Status: AC
  Administered 2023-09-01: 4 mg via INTRAVENOUS
  Filled 2023-09-01: qty 1

## 2023-09-01 MED ORDER — AMOXICILLIN-POT CLAVULANATE 875-125 MG PO TABS: 1.0000 | ORAL_TABLET | Freq: Two times a day (BID) | ORAL | 0 refills | Status: DC

## 2023-09-01 MED ORDER — POTASSIUM CHLORIDE CRYS ER 20 MEQ PO TBCR
40.0000 meq | EXTENDED_RELEASE_TABLET | Freq: Once | ORAL | Status: AC
  Administered 2023-09-01: 40 meq via ORAL
  Filled 2023-09-01: qty 2

## 2023-09-01 MED ORDER — IOHEXOL 300 MG/ML  SOLN
100.0000 mL | Freq: Once | INTRAMUSCULAR | Status: AC | PRN
  Administered 2023-09-01: 100 mL via INTRAVENOUS

## 2023-09-01 NOTE — ED Notes (Signed)
Patient transported to CT 

## 2023-09-01 NOTE — ED Provider Notes (Addendum)
[name] Provider Note   CSN: [id number] Arrival date & time: 09/01/23  1420     History  Chief Complaint  Patient presents with   Abdominal Pain    Jill Davis is a 57 y.o. female with medical history to include morbid obesity, substance abuse, heart murmur, GERD, depression, asthma, anxiety and arthritis.  She presents to the ED for evaluation of abdominal pain.  She reports that for the last 5 or 6 days she has had on and off abdominal pain which she states will resolve on its own.  She states that for the last 2 weeks she has been taking her friend's Ozempic which she stole because she was "desperate to lose weight" and her doctor would not prescribe it to her.  She states that today her husband purchased her a frappe from [employer].  She reports that when she ingested this she began to have excruciating abdominal pain located primary to her lower abdominal fields.  She reports that she has been unable to urinate fully for the last 2 days.  She states that she is also having slight dysuria.  Also complaining of constipation when she reports began after beginning Ozempic.  States that she had a bowel movement this morning but very small amount of stool came out.  Denies fevers, flank pain, or vomiting.  Received Zofran en route with EMS for nausea.  Patient in extreme out of pain on my examination.   Abdominal Pain      Home Medications Prior to Admission medications   Medication Sig Start Date End Date Taking? Authorizing Provider  amoxicillin-clavulanate (AUGMENTIN) 875-125 MG tablet Take 1 tablet by mouth every 12 (twelve) hours. 09/01/23  Yes Al Decant, PA-C  dicyclomine (BENTYL) 20 MG tablet Take 1 tablet (20 mg total) by mouth 2 (two) times daily. 09/01/23  Yes Al Decant, PA-C  ondansetron (ZOFRAN) 4 MG tablet Take 1 tablet (4 mg total) by mouth every 6 (six) hours as needed for nausea or vomiting.  09/01/23  Yes Al Decant, PA-C  sodium phosphate (FLEET) ENEM Place 133 mLs (1 enema total) rectally once for 1 dose. 09/01/23 09/01/23 Yes Al Decant, PA-C  albuterol (VENTOLIN HFA) 108 (90 Base) MCG/ACT inhaler Inhale 2 puffs into the lungs every 6 (six) hours as needed for wheezing or shortness of breath. 10/02/22   Philip Aspen, Limmie Patricia, MD  azithromycin (ZITHROMAX) 250 MG tablet Take 1 tablet (250 mg total) by mouth daily. Take first 2 tablets together, then 1 every day until finished. 03/07/23   Sloan Leiter, DO  buPROPion (WELLBUTRIN XL) 150 MG 24 hr tablet TAKE 1 TABLET BY MOUTH EVERY DAY 08/02/23   Philip Aspen, Limmie Patricia, MD  Calcium Carb-Cholecalciferol (CALCIUM 600/VITAMIN D PO) Take by mouth.    [provider]  cyclobenzaprine (FLEXERIL) 5 MG tablet Take 1 tablet (5 mg total) by mouth at bedtime as needed for muscle spasms. 06/19/23   Philip Aspen, Limmie Patricia, MD  diphenhydramine-acetaminophen (TYLENOL PM) 25-500 MG TABS tablet Take 2 tablets by mouth at bedtime as needed (sleep.).    [provider]  ibuprofen (ADVIL) 200 MG tablet Take 1,000 mg by mouth every 8 (eight) hours as needed (for pain.).     [provider]  meloxicam (MOBIC) 15 MG tablet TAKE 1 TABLET (15 MG TOTAL) BY MOUTH DAILY. 04/10/23   Philip Aspen, Limmie Patricia, MD  nystatin (MYCOSTATIN) 100000 UNIT/ML suspension Take 5  mLs (500,000 Units total) by mouth 4 (four) times daily. 06/04/23   Philip Aspen, Limmie Patricia, MD  ofloxacin (OCUFLOX) 0.3 % ophthalmic solution Place 1 drop into the left eye 4 (four) times daily. 05/12/21   Philip Aspen, Limmie Patricia, MD  tiZANidine (ZANAFLEX) 4 MG tablet Take 1 tablet (4 mg total) by mouth every 8 (eight) hours as needed for muscle spasms. 03/20/23   Philip Aspen, Limmie Patricia, MD  triamcinolone cream (KENALOG) 0.1 % Apply 1 application topically 2 (two) times daily. 05/12/21   Philip Aspen, Limmie Patricia, MD  zolpidem (AMBIEN) 10 MG  tablet TAKE 1 TABLET BY MOUTH AT BEDTIME AS NEEDED FOR SLEEP. (INSURANCE COVERS 15 TABS/30 DAYS 08/07/23   Philip Aspen, Limmie Patricia, MD  zonisamide (ZONEGRAN) 50 MG capsule TAKE 1 CAPSULE BY MOUTH AT BEDTIME. 06/12/23   Ihor Austin, NP      Allergies    Celebrex [celecoxib]    Review of Systems   Review of Systems  Gastrointestinal:  Positive for abdominal pain.  All other systems reviewed and are negative.   Physical Exam Updated Vital Signs BP (!) 148/61 (BP Location: Right Arm)   Pulse (!) 56   Temp 97.9 F (36.6 C) (Oral)   Resp 18   Ht 5\' 4"  (1.626 m)   Wt (!) 142.9 kg   SpO2 97%   BMI 54.07 kg/m  Physical Exam Vitals and nursing note reviewed.  Constitutional:      General: She is not in acute distress.    Appearance: Normal appearance. She is not ill-appearing, toxic-appearing or diaphoretic.  HENT:     Head: Normocephalic and atraumatic.     Nose: Nose normal.     Mouth/Throat:     Mouth: Mucous membranes are moist.     Pharynx: Oropharynx is clear.  Eyes:     Extraocular Movements: Extraocular movements intact.     Conjunctiva/sclera: Conjunctivae normal.     Pupils: Pupils are equal, round, and reactive to light.  Cardiovascular:     Rate and Rhythm: Normal rate and regular rhythm.  Pulmonary:     Effort: Pulmonary effort is normal.     Breath sounds: Normal breath sounds. No wheezing.  Abdominal:     General: Abdomen is flat. Bowel sounds are normal.     Palpations: Abdomen is soft.     Tenderness: There is abdominal tenderness.  Skin:    General: Skin is warm and dry.     Capillary Refill: Capillary refill takes less than 2 seconds.  Neurological:     Mental Status: She is alert and oriented to person, place, and time.     ED Results / Procedures / Treatments   Labs (all labs ordered are listed, but only abnormal results are displayed) Labs Reviewed  COMPREHENSIVE METABOLIC PANEL - Abnormal; Notable for the following components:      Result  Value   Potassium 3.2 (*)    CO2 20 (*)    Calcium 8.6 (*)    All other components within normal limits  I-STAT CHEM 8, ED - Abnormal; Notable for the following components:   Potassium 3.3 (*)    Glucose, Bld 102 (*)    Calcium, Ion 1.14 (*)    TCO2 19 (*)    All other components within normal limits  URINALYSIS, ROUTINE W REFLEX MICROSCOPIC  CBC  LIPASE, BLOOD    EKG None  Radiology CT ABDOMEN PELVIS W CONTRAST  Result Date: 09/01/2023 CLINICAL DATA:  Lower sharp  stabbing abdominal pain 10/10. Difficulty urinating yesterday. Unable to have bowel movement. Started on Zantac 3 weeks ago. EXAM: CT ABDOMEN AND PELVIS WITH CONTRAST TECHNIQUE: Multidetector CT imaging of the abdomen and pelvis was performed using the standard protocol following bolus administration of intravenous contrast. RADIATION DOSE REDUCTION: This exam was performed according to the departmental dose-optimization program which includes automated exposure control, adjustment of the mA and/or kV according to patient size and/or use of iterative reconstruction technique. CONTRAST:  OMNIPAQUE IOHEXOL 300 MG/ML  SOLN COMPARISON:  None Available. FINDINGS: Lower chest: No acute abnormality. Hepatobiliary: Hepatic steatosis. Normal gallbladder. No biliary dilation. Pancreas: Unremarkable. Spleen: Unremarkable. Adrenals/Urinary Tract: Normal adrenal glands. No urinary calculi or hydronephrosis. Bladder is unremarkable. Stomach/Bowel: Normal caliber large and small bowel. Mild wall thickening and inflammatory stranding about the descending colon. The appendix is normal.Stomach is within normal limits. Vascular/Lymphatic: No significant vascular findings are present. No enlarged abdominal or pelvic lymph nodes. Reproductive: Status post hysterectomy. No adnexal masses. Other: Trace free fluid in the left pericolic gutter. No abscess. No free intraperitoneal air. Musculoskeletal: No acute fracture. IMPRESSION: 1. Mild colitis of  the descending colon. 2. Hepatic steatosis. Electronically Signed   By: Minerva Fester M.D.   On: 09/01/2023 18:50   DG Abdomen 1 View  Result Date: 09/01/2023 CLINICAL DATA:  Constipation, abdominal pain. EXAM: ABDOMEN - 1 VIEW COMPARISON:  Chest CT dated 03/07/2023 FINDINGS: The bowel gas pattern is normal. No radio-opaque calculi overlying the kidneys or expected course of the urinary tract. Degenerative changes are seen in the spine. IMPRESSION: Nonobstructive bowel gas pattern. Electronically Signed   By: Romona Curls M.D.   On: 09/01/2023 16:52    Procedures Procedures   Medications Ordered in ED Medications  potassium chloride SA (KLOR-CON M) CR tablet 40 mEq (has no administration in time range)  morphine (PF) 4 MG/ML injection 4 mg (4 mg Intravenous Given 09/01/23 1519)  iohexol (OMNIPAQUE) 300 MG/ML solution 100 mL (100 mLs Intravenous Contrast Given 09/01/23 1716)  HYDROmorphone (DILAUDID) injection 1 mg (1 mg Intravenous Given 09/01/23 1816)    ED Course/ Medical Decision Making/ A&P  Medical Decision Making Amount and/or Complexity of Data Reviewed Labs: ordered. Radiology: ordered.  Risk OTC drugs. Prescription drug management.   57 year old female presents to ED for evaluation.  Please see HPI for further details.  On examination patient is afebrile, nontachycardic.  Her lung sounds are clear bilaterally, she is not hypoxic.  She does have nonfocal generalized abdominal tenderness.  No CVA tenderness.  Neurological examination at baseline.  Initially provided patient 4 milligrams morphine for pain.  Will collect labs to include CBC, CMP, urinalysis, lipase, CT abdomen pelvis with contrast.  CBC without leukocytosis or anemia.  CMP shows potassium 3.2 repleted with 40 mEq oral potassium, no other electrolyte derangement, no elevated LFTs, anion gap 9.  Lipase 29.  Urinalysis unremarkable.  CT abdomen pelvis shows colitis of descending colon but no other acute  process.  Patient requested additional pain medications 1 mg Dilaudid administered.  At this time the patient will be discharged home with Augmentin, Zofran and Emla.  She will follow-up with her PCP next week.  She has had no nausea or vomiting here.  She was advised to discontinue use of Ozempic especially because she has not been prescribed this.  Patient also requesting to be sent with enema for constipation, this was into her pharmacy.  Stable for discharge.   Final Clinical Impression(s) / ED Diagnoses Final diagnoses:  Colitis    Rx / DC Orders ED Discharge Orders          Ordered    amoxicillin-clavulanate (AUGMENTIN) 875-125 MG tablet  Every 12 hours        09/01/23 1905    sodium phosphate (FLEET) ENEM   Once        09/01/23 1905    ondansetron (ZOFRAN) 4 MG tablet  Every 6 hours PRN        09/01/23 1906    dicyclomine (BENTYL) 20 MG tablet  2 times daily        09/01/23 1913                  Clent Ridges 09/01/23 1913    Lorre Nick, MD 09/01/23 2142

## 2023-09-01 NOTE — ED Triage Notes (Signed)
Patient bib [facility] Lower sharp stabbing abd pain  10/10 Started 1 hour ago Applying pressure relieves pain Started after patient had frappe Trouble peeing  yesterday and today Unable to have bowel movement Started ozempic 3 weeks ago   VS w/ EMS 123/100 , hr 67, cbg 108, 4 mg zofran odt en route. Patient feels nauseated and as if she may faint.   20 g IV left hand

## 2023-09-01 NOTE — ED Notes (Signed)
Patient says she drank frappe from [employer] and it tasted funny. After drinking, abd pain began

## 2023-09-01 NOTE — Discharge Instructions (Addendum)
It was a pleasure taking part in your care today.  As we discussed, your CT scan shows that you have colitis.  Please begin taking Augmentin twice a day for the next 7 days.  Please follow-up with your PCP as we discussed.  Please discontinue use of Ozempic.  Please return to the ED with any new or worsening signs or symptoms.  Please utilize Fleet enema sent to your pharmacy.  Read attached guide concerning colitis.

## 2023-09-02 ENCOUNTER — Other Ambulatory Visit: Payer: Self-pay | Admitting: Internal Medicine

## 2023-09-02 DIAGNOSIS — G4700 Insomnia, unspecified: Secondary | ICD-10-CM

## 2023-09-12 ENCOUNTER — Ambulatory Visit: Payer: 59 | Admitting: Neurology

## 2023-09-12 ENCOUNTER — Encounter: Payer: Self-pay | Admitting: Internal Medicine

## 2023-09-12 ENCOUNTER — Ambulatory Visit: Payer: 59 | Admitting: Internal Medicine

## 2023-09-12 ENCOUNTER — Encounter: Payer: Self-pay | Admitting: Neurology

## 2023-09-12 VITALS — BP 124/78 | Temp 97.8°F | Wt 289.3 lb

## 2023-09-12 VITALS — BP 113/67 | HR 71 | Ht 64.5 in | Wt 287.6 lb

## 2023-09-12 DIAGNOSIS — G4733 Obstructive sleep apnea (adult) (pediatric): Secondary | ICD-10-CM

## 2023-09-12 DIAGNOSIS — G4700 Insomnia, unspecified: Secondary | ICD-10-CM

## 2023-09-12 DIAGNOSIS — K529 Noninfective gastroenteritis and colitis, unspecified: Secondary | ICD-10-CM | POA: Diagnosis not present

## 2023-09-12 DIAGNOSIS — G932 Benign intracranial hypertension: Secondary | ICD-10-CM | POA: Diagnosis not present

## 2023-09-12 DIAGNOSIS — Z09 Encounter for follow-up examination after completed treatment for conditions other than malignant neoplasm: Secondary | ICD-10-CM

## 2023-09-12 MED ORDER — ZONISAMIDE 50 MG PO CAPS: 50.0000 mg | ORAL_CAPSULE | Freq: Two times a day (BID) | ORAL | 5 refills | Status: DC

## 2023-09-12 MED ORDER — ZOLPIDEM TARTRATE 10 MG PO TABS
10.0000 mg | ORAL_TABLET | Freq: Every evening | ORAL | 1 refills | Status: DC | PRN
Start: 2023-09-12 — End: 2023-10-11

## 2023-09-12 NOTE — Telephone Encounter (Signed)
Fax confirmation received [phone] Dr. Lelan Pons.

## 2023-09-12 NOTE — Progress Notes (Signed)
Established Patient Office Visit     CC/Reason for Visit: ED follow-up  HPI: Jill Davis is a 57 y.o. female who is coming in today for the above mentioned reasons. Past Medical History is significant for: Pseudotumor cerebri, OSA, morbid obesity, vitamin D deficiency, insomnia.  On October 12 she visited the emergency department with severe abdominal pain.  She later developed diarrhea.  CT scan showed descending colitis.  She was placed on antibiotics and pain medication and discharged home.  She is feeling improved although she remains tired and fatigued.  On "Sunday she had over 20 BMs but these have tapered down.  She remembers eating at [employer] right before symptoms started.   Past Medical/Surgical History: Past Medical History:  Diagnosis Date   Allergy    grass    Anemia    Anxiety    Arthritis    knees x 2   and hip on left    Asthma    none since moved to [place]    Depression    GERD (gastroesophageal reflux disease)    PRn Rolaids -  gets with red sauces etc    Heart murmur    as baby    History of substance abuse (HCC)    Insomnia    Morbid obesity (HCC)    Neuromuscular disorder (HCC)    plantar fascitis    Substance abuse (HCC)    Thyroid disease    currently no meds     Past Surgical History:  Procedure Laterality Date   ABDOMINAL HYSTERECTOMY     BREAST BIOPSY Left 11/21/2015   benign   COLONOSCOPY WITH PROPOFOL N/A 10/05/2020   Procedure: COLONOSCOPY WITH PROPOFOL;  Surgeon: Beavers, Kimberly, MD;  Location: [facility];  Service: Gastroenterology;  Laterality: N/A;   cyst on buttocks      PARTIAL HYSTERECTOMY     prior to abd full hysterectomy   POLYPECTOMY  10/05/2020   Procedure: POLYPECTOMY;  Surgeon: Beavers, Kimberly, MD;  Location: [facility];  Service: Gastroenterology;;    Social History:  reports that she has quit smoking. She has never used smokeless tobacco. She reports that she does not currently use alcohol. She  reports that she does not currently use drugs.  Allergies: Allergies  Allergen Reactions   Celebrex [Celecoxib] Itching   Dicyclomine Rash    Family History:  Family History  Problem Relation Age of Onset   Diabetes Mother    Hypertension Mother    Sleep apnea Mother    CAD Father    Hypertension Father    Sleep apnea Father    Breast cancer Sister        40" s   Sleep apnea Sister    Hypertension Brother    Colon cancer Neg Hx    Colon polyps Neg Hx    Esophageal cancer Neg Hx    Stomach cancer Neg Hx    Rectal cancer Neg Hx      Current Outpatient Medications:    albuterol (VENTOLIN HFA) 108 (90 Base) MCG/ACT inhaler, Inhale 2 puffs into the lungs every 6 (six) hours as needed for wheezing or shortness of breath., Disp: 8 g, Rfl: 2   buPROPion (WELLBUTRIN XL) 150 MG 24 hr tablet, TAKE 1 TABLET BY MOUTH EVERY DAY, Disp: 90 tablet, Rfl: 0   Calcium Carb-Cholecalciferol (CALCIUM 600/VITAMIN D PO), Take by mouth., Disp: , Rfl:    cyclobenzaprine (FLEXERIL) 5 MG tablet, Take 1 tablet (5 mg total) by mouth at  bedtime as needed for muscle spasms., Disp: 30 tablet, Rfl: 1   diphenhydramine-acetaminophen (TYLENOL PM) 25-500 MG TABS tablet, Take 2 tablets by mouth at bedtime as needed (sleep.)., Disp: , Rfl:    ibuprofen (ADVIL) 200 MG tablet, Take 1,000 mg by mouth every 8 (eight) hours as needed (for pain.). , Disp: , Rfl:    nystatin (MYCOSTATIN) 100000 UNIT/ML suspension, Take 5 mLs (500,000 Units total) by mouth 4 (four) times daily., Disp: 60 mL, Rfl: 0   ofloxacin (OCUFLOX) 0.3 % ophthalmic solution, Place 1 drop into the left eye 4 (four) times daily., Disp: 5 mL, Rfl: 0   triamcinolone cream (KENALOG) 0.1 %, Apply 1 application topically 2 (two) times daily., Disp: 30 g, Rfl: 0   zonisamide (ZONEGRAN) 50 MG capsule, Take 1 capsule (50 mg total) by mouth 2 (two) times daily., Disp: 60 capsule, Rfl: 5   zolpidem (AMBIEN) 10 MG tablet, Take 1 tablet (10 mg total) by mouth at  bedtime as needed for sleep., Disp: 30 tablet, Rfl: 1  Review of Systems:  Negative unless indicated in HPI.   Physical Exam: Vitals:   09/12/23 1528  BP: 124/78  Temp: 97.8 F (36.6 C)  TempSrc: Oral  Weight: 289 lb 4.8 oz (131.2 kg)    Body mass index is 48.89 kg/m.   Physical Exam Vitals reviewed.  Constitutional:      Appearance: Normal appearance. She is obese.  HENT:     Head: Normocephalic and atraumatic.  Eyes:     Conjunctiva/sclera: Conjunctivae normal.     Pupils: Pupils are equal, round, and reactive to light.  Cardiovascular:     Rate and Rhythm: Normal rate and regular rhythm.  Pulmonary:     Effort: Pulmonary effort is normal.     Breath sounds: Normal breath sounds.  Skin:    General: Skin is warm and dry.  Neurological:     General: No focal deficit present.     Mental Status: She is alert and oriented to person, place, and time.  Psychiatric:        Mood and Affect: Mood normal.        Behavior: Behavior normal.        Thought Content: Thought content normal.        Judgment: Judgment normal.      Impression and Plan:  Hospital discharge follow-up  Colitis  Morbid obesity (HCC)  Insomnia, unspecified type -     Zolpidem Tartrate; Take 1 tablet (10 mg total) by mouth at bedtime as needed for sleep.  Dispense: 30 tablet; Refill: 1  -Hospital charts reviewed in detail. -Has completed a round of antibiotic.  Suspect weakness and fatigue related to dehydration from ongoing colitis symptoms.  Advised to rehydrate with Pedialyte and Gatorade. -She has suspended Ozempic use although I do not believe this had anything to do with her symptoms. -Ambien refills sent today.   Time spent:32 minutes reviewing chart, interviewing and examining patient and formulating plan of care.     Chaya Jan, MD [facility]

## 2023-09-12 NOTE — Patient Instructions (Signed)
Please continue using your AutoPap consistently, all night, every night. Cut back on caffeine intake, reduce your soda intake with caffeine to limit yourself to less than 2 servings per day. Maintain good hydration with water, 6 to 8 cups/day or 3-4 bottles per day on average.  Continue with your weight loss journey. Please be reminded that it is critical that you have regular eye examinations with your ophthalmologist.  I highly recommend that you go back to see Dr. Lelan Pons as he is familiar with your eye exam and can do a comparison with your previous exam.  Please request that he send his records to [place] so we are up-to-date and all on the same page.  Remember, what we had talked about when we initially discussed the diagnosis of pseudotumor cerebri.  The most serious complication of having pseudotumor cerebri is vision loss which can be permanent.   Please continue with zonisamide 50 mg twice daily.  I have renewed your prescription as your previous prescription did not reflect the twice daily dosing.  Follow-up to see Ihor Austin, NP in 6 to 8 months in this clinic.  Call us or email [place] through MyChart with any interim questions or concerns.

## 2023-09-12 NOTE — Progress Notes (Signed)
Subjective:    Patient ID: Jill Davis is a 57 y.o. female.  HPI    Interim history:   Jill Davis is a 57 year old right-handed woman with an underlying medical history of allergies, arthritis, asthma, anemia, anxiety, depression, reflux disease, vitamin D deficiency, and morbid obesity with a BMI of over 45, who presents for follow-up consultation of her obstructive sleep apnea and IIH.  She saw Ihor Austin, NP on 10/30/2022, at which time she reported having had side effects with Diamox and Topamax.  She was working on weight loss.  She was advised to start zonisamide.  Today, 09/12/2023: I reviewed her AutoPap compliance data from 08/12/2023 through 09/10/2023, which is a total of 30 days, during which time she used her machine 29 days with percent use days greater than 4 hours at 60%, indicating mildly suboptimal compliance with an average usage of 5 hours and 53 minutes for days on treatment, residual AHI at goal at 1.4/h, 95th percentile of pressure at 8 cm with a range of 6 to 12 cm with EPR of 3.  Leak acceptable with the 95th percentile at 9.4 L/min. She reports not liking her AutoPap but she is agreeable to continuing.  She reports taking the zonisamide twice daily.  She takes 50 mg in the morning and 50 at bedtime.  Her prescription still indicates 50 mg nightly.  She does not know when her last ophthalmology appointment was.  She had a recent eye exam for new glasses and contacts with [facility].  She reports that she had a dilated eye exam at the time but they did not mention anything about her eye background.  She is not sure when she last saw Dr. Lelan Pons and is not sure if she wants to go back to his practice or see a new ophthalmologist.  She is encouraged to go back to her established ophthalmologist as he is familiar with her eye exam.  She is strongly advised to get an updated eye exam and also requested to have them send records to [place] so we are up-to-date.  She denies  any new eye related symptoms such as blurry vision, loss of vision, or double vision.  She has prescription contact lenses and readers.  She is working on weight loss and has lost about 30 pounds since December 2023.  She indicates that she drinks about 4 servings of soda per day.  The patient's allergies, current medications, family history, past medical history, past social history, past surgical history and problem list were reviewed and updated as appropriate.    Previously:  07/11/2022: I reviewed her AutoPap compliance data from 06/10/2022 through 07/09/2022, which is a total of 30 days, during which time she used her machine 27 days with percent use days greater than 4 hours at 80%, indicating very good compliance with an average usage of 7 hours and 12 minutes, residual AHI at goal at 0.9/h, average pressure for the 95th percentile at 8.2 cm with a range of 6 to 12 cm with EPR of 3.  Leak on the higher side with the 95th percentile at 25.5 L/min.  She reports still adjusting to treatment, she is a restless sleeper, she tried the nasal cushion interface but it was uncomfortable and now she is on nasal pillows.  She tried a chinstrap but it was very uncomfortable and caused her to have headaches.  She actually has discontinued using a chinstrap and interestingly her leak is actually lower now.  She has had improvement  in her nocturia from 2-3 times per night down to once per night.  Her Epworth sleepiness score is 8 out of 24 today.  She is trying to sleep on her sides.  She feels slightly better with regards to her daytime energy and sleepiness.  She is motivated to continue treatment with AutoPap therapy.   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 She was referred by Dr. Lelan Pons, retina specialist at the time.  When I explained to her that we do need to make another appointment to talk about this condition and the possibility of underlying pseudotumor  cerebri, the patient reported that she did not want this evaluated and that "I don't care anymore". She is worried about additional cost for every specialty visit.  She is worried that her eye doctor would be upset with her as she has an appointment coming up this week and she has not pursued or does not wish to pursue evaluation and treatment for optic disc edema.  She was advised strongly to make another appointment with me so we can discuss in much more detail her optic disc edema.  During the visit, it was clear that she had major concerns about financial constraints.  I offered her a brief explanation of the condition called pseudotumor cerebri and the prognosis and treatment options as well as diagnostic test options for pseudotumor cerebri.  She was aware that a lumbar puncture is typically recommended.  She is reluctant to proceed but did eventually agreed to a referral to radiology for spinal tap under fluoroscopic guidance.  I explained this procedure to her.  She is apprehensive about this and her follow-up with her retina specialist.  I spent extra time today explaining that it could eventually affect her vision and also in the worst case scenario lead to irreversible vision loss, even blindness.  I reviewed the office note from 04/03/2022, when she saw Dr. Lelan Pons.  She reported decreased vision over the past several months.  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 She had an interim brain MRI with and without contrast.    She had a brain and orbital MRI with and without contrast through [facility] on 04/19/2022 and I reviewed the results: IMPRESSION:    1.  Normal MRI of the orbits, specifically no evidence of optic nerve papilledema..    2.  Single foci of FLAIR signal in the right cerebral subcortical white matter nonspecific, but commonly seen with small vessel ischemic change.   I had evaluated her for sleep apnea concern a little over a year ago.   She has been working  on weight loss.  She reports that she would not be able to pursue bariatric surgery because her insurance would not cover it.    I first met her at the request of her primary care physician on 04/19/2021, at which time she reported snoring and excessive daytime somnolence, as well as difficulty initiating and maintaining sleep.  She was advised to proceed with sleep study.  She had a home sleep test on 05/16/2021 which indicated moderate obstructive sleep apnea with a total AHI of 29.2/hour and O2 nadir of 84%. Snoring ranged from mild to loud.  She was advised to proceed with AutoPap therapy.  Her set up date was 05/12/22.  She has a ResMed air sense 11 AutoSet machine.       04/19/21: (She) reports difficulty initiating and maintaining sleep as well  as snoring and daytime somnolence.  I reviewed your office note from 02/03/2021.  Her Epworth sleepiness score is 9 out of 24, fatigue severity score is 61 out of 63.  Her husband reports that her snoring can be loud.  She has a family history of sleep apnea affecting her mom and her sister, both have CPAP machines.  The patient has never had a sleep study.  She has tried Ambien in the past and was on it for some years, then was off for several years and recently restarted it.  She has a bedtime of around 10 PM and rise time of around 7 or 7:30 AM.  She drinks caffeine in the form of soda, about 3 servings per average day, no alcohol and no drugs for over 30 years.  She quit smoking about a year and a half ago.  She lives with her husband, they have 2 cats in the household, she does sleep with the TV on at night in her bedroom.  She has a history of bruxism and has tried an over-the-counter bite guard.  She has tried over-the-counter medication for sleep including ZzzQuil and melatonin.  She does nap during the day when she can, not daily.  She works mostly from home.  She has gained maybe 25 to 30 pounds in the past 2 years.  She had a tonsillectomy as a child.  She  has had occasional morning headaches and has nocturia about once per average night.    Her Past Medical History Is Significant For: Past Medical History:  Diagnosis Date   Allergy    grass    Anemia    Anxiety    Arthritis    knees x 2   and hip on left    Asthma    none since moved to [place]    Depression    GERD (gastroesophageal reflux disease)    PRn Rolaids -  gets with red sauces etc    Heart murmur    as baby    History of substance abuse (HCC)    Insomnia    Morbid obesity (HCC)    Neuromuscular disorder (HCC)    plantar fascitis    Substance abuse (HCC)    Thyroid disease    currently no meds     Her Past Surgical History Is Significant For: Past Surgical History:  Procedure Laterality Date   ABDOMINAL HYSTERECTOMY     BREAST BIOPSY Left 11/21/2015   benign   COLONOSCOPY WITH PROPOFOL N/A 10/05/2020   Procedure: COLONOSCOPY WITH PROPOFOL;  Surgeon: Tressia Danas, MD;  Location: [facility];  Service: Gastroenterology;  Laterality: N/A;   cyst on buttocks      PARTIAL HYSTERECTOMY     prior to abd full hysterectomy   POLYPECTOMY  10/05/2020   Procedure: POLYPECTOMY;  Surgeon: Tressia Danas, MD;  Location: [facility];  Service: Gastroenterology;;    Her Family History Is Significant For: Family History  Problem Relation Age of Onset   Diabetes Mother    Hypertension Mother    Sleep apnea Mother    CAD Father    Hypertension Father    Sleep apnea Father    Breast cancer Sister        53s   Sleep apnea Sister    Hypertension Brother    Colon cancer Neg Hx    Colon polyps Neg Hx    Esophageal cancer Neg Hx    Stomach cancer Neg Hx    Rectal cancer  Neg Hx     Her Social History Is Significant For: Social History   Socioeconomic History   Marital status: Married    Spouse name: Not on file   Number of children: Not on file   Years of education: Not on file   Highest education level: Associate degree: occupational, Scientist, product/process development, or  vocational program  Occupational History   Not on file  Tobacco Use   Smoking status: Former   Smokeless tobacco: Never  Substance and Sexual Activity   Alcohol use: Not Currently   Drug use: Not Currently    Comment: quit 1990   Sexual activity: Not on file  Other Topics Concern   Not on file  Social History Narrative   Not on file   Social Determinants of Health   Financial Resource Strain: Medium Risk (09/08/2023)   Overall Financial Resource Strain (CARDIA)    Difficulty of Paying Living Expenses: Somewhat hard  Food Insecurity: Food Insecurity Present (09/08/2023)   Hunger Vital Sign    Worried About Running Out of Food in the Last Year: Sometimes true    Ran Out of Food in the Last Year: Sometimes true  Transportation Needs: No Transportation Needs (09/08/2023)   PRAPARE - Administrator, Civil Service (Medical): No    Lack of Transportation (Non-Medical): No  Physical Activity: Insufficiently Active (09/08/2023)   Exercise Vital Sign    Days of Exercise per Week: 3 days    Minutes of Exercise per Session: 40 min  Stress: Stress Concern Present (09/08/2023)   [employer] of Occupational Health - Occupational Stress Questionnaire    Feeling of Stress : Very much  Social Connections: Socially Integrated (09/08/2023)   Social Connection and Isolation Panel [NHANES]    Frequency of Communication with Friends and Family: More than three times a week    Frequency of Social Gatherings with Friends and Family: Never    Attends Religious Services: 1 to 4 times per year    Active Member of [employer] or Organizations: Yes    Attends Engineer, structural: More than 4 times per year    Marital Status: Married    Her Allergies Are:  Allergies  Allergen Reactions   Celebrex [Celecoxib] Itching   Dicyclomine Rash  :   Her Current Medications Are:  Outpatient Encounter Medications as of 09/12/2023  Medication Sig   albuterol (VENTOLIN HFA) 108 (90  Base) MCG/ACT inhaler Inhale 2 puffs into the lungs every 6 (six) hours as needed for wheezing or shortness of breath.   buPROPion (WELLBUTRIN XL) 150 MG 24 hr tablet TAKE 1 TABLET BY MOUTH EVERY DAY   Calcium Carb-Cholecalciferol (CALCIUM 600/VITAMIN D PO) Take by mouth.   cyclobenzaprine (FLEXERIL) 5 MG tablet Take 1 tablet (5 mg total) by mouth at bedtime as needed for muscle spasms.   diphenhydramine-acetaminophen (TYLENOL PM) 25-500 MG TABS tablet Take 2 tablets by mouth at bedtime as needed (sleep.).   ibuprofen (ADVIL) 200 MG tablet Take 1,000 mg by mouth every 8 (eight) hours as needed (for pain.).    nystatin (MYCOSTATIN) 100000 UNIT/ML suspension Take 5 mLs (500,000 Units total) by mouth 4 (four) times daily.   ofloxacin (OCUFLOX) 0.3 % ophthalmic solution Place 1 drop into the left eye 4 (four) times daily.   triamcinolone cream (KENALOG) 0.1 % Apply 1 application topically 2 (two) times daily.   zolpidem (AMBIEN) 10 MG tablet TAKE 1 TABLET BY MOUTH AT BEDTIME AS NEEDED FOR SLEEP. (INSURANCE COVERS 15  TABS/30 DAYS   zonisamide (ZONEGRAN) 50 MG capsule TAKE 1 CAPSULE BY MOUTH AT BEDTIME.   [DISCONTINUED] amoxicillin-clavulanate (AUGMENTIN) 875-125 MG tablet Take 1 tablet by mouth every 12 (twelve) hours.   [DISCONTINUED] azithromycin (ZITHROMAX) 250 MG tablet Take 1 tablet (250 mg total) by mouth daily. Take first 2 tablets together, then 1 every day until finished.   [DISCONTINUED] dicyclomine (BENTYL) 20 MG tablet Take 1 tablet (20 mg total) by mouth 2 (two) times daily.   [DISCONTINUED] meloxicam (MOBIC) 15 MG tablet TAKE 1 TABLET (15 MG TOTAL) BY MOUTH DAILY.   [DISCONTINUED] ondansetron (ZOFRAN) 4 MG tablet Take 1 tablet (4 mg total) by mouth every 6 (six) hours as needed for nausea or vomiting.   [DISCONTINUED] tiZANidine (ZANAFLEX) 4 MG tablet Take 1 tablet (4 mg total) by mouth every 8 (eight) hours as needed for muscle spasms.   No facility-administered encounter medications on  file as of 09/12/2023.  :  Review of Systems:  Out of a complete 14 point review of systems, all are reviewed and negative with the exception of these symptoms as listed below:  Review of Systems  Neurological:        Follow up for cpap (annual).  ESS 6.  "She hates cpap".    Objective:  Neurological Exam  Physical Exam Physical Examination:   Vitals:   09/12/23 0735  BP: 113/67  Pulse: 71    General Examination: The patient is a very pleasant 57 y.o. female in no acute distress. She appears well-developed and well-nourished and well groomed.   HEENT: Normocephalic, atraumatic, pupils are equal, round and reactive to light, colored contact lenses in place.  No photophobia, funduscopic exam normal hazy rims of optic disc bilaterally, possible mild papilledema bilaterally.  Extraocular tracking well-preserved.  Hearing grossly intact.  Face is symmetric with normal facial animation. Speech is clear with no dysarthria noted. There is no hypophonia. There is no lip, neck/head, jaw or voice tremor. Neck is supple with full range of passive and active motion. There are no carotid bruits on auscultation. Oropharynx exam reveals: mild mouth dryness, adequate dental hygiene and moderate airway crowding. Tongue protrudes centrally and palate elevates symmetrically.   Chest: Clear to auscultation without wheezing, rhonchi or crackles noted.   Heart: S1+S2+0, regular and normal without murmurs, rubs or gallops noted.    Abdomen: Soft, non-tender and non-distended.   Extremities: There is no obvious edema in the distal lower extremities bilaterally.    Skin: Warm and dry without trophic changes noted.    Musculoskeletal: exam reveals no obvious joint deformities.    Neurologically:  Mental status: The patient is awake, alert and oriented in all 4 spheres. Her immediate and remote memory, attention, language skills and fund of knowledge are appropriate. There is no evidence of aphasia,  agnosia, apraxia or anomia. Speech is clear with normal prosody and enunciation. Thought process is linear. Mood is normal and affect is normal.  Cranial nerves II - XII are as described above under HEENT exam.  Motor exam: Normal bulk, strength and tone is noted. There is no obvious resting or action tremor.  Fine motor skills and coordination: grossly intact.  Cerebellar testing: No dysmetria or intention tremor. There is no truncal or gait ataxia.  Sensory exam: intact to light touch in the upper and lower extremities.  Gait, station and balance: She stands easily. No veering to one side is noted. No leaning to one side is noted. Posture is age-appropriate and stance is  narrow based. Gait shows normal stride length and normal pace. No problems turning are noted.    Assessment and Plan:    In summary, Breckin Dan Davis is a 57 year old right-handed woman with an underlying medical history of allergies, arthritis, asthma, anemia, anxiety, depression, reflux disease, vitamin D deficiency, and morbid obesity with a BMI of over 45, who presents for follow-up consultation of her obstructive sleep apnea and IIH.  She is advised to be fully compliant with her AutoPap.  She is slightly suboptimal with her compliance currently.  She is on zonisamide 50 mg twice daily.  She could not tolerate Diamox or Topamax in the recent past.  She is strongly advised to get an updated eye examination.  Her instructions she was given verbally and in her MyChart after visit summary are copied below:   <<Please continue using your AutoPap consistently, all night, every night. Cut back on caffeine intake, reduce your soda intake with caffeine to limit yourself to less than 2 servings per day. Maintain good hydration with water, 6 to 8 cups/day or 3-4 bottles per day on average.  Continue with your weight loss journey. Please be reminded that it is critical that you have regular eye examinations with your ophthalmologist.  I  highly recommend that you go back to see Dr. Lelan Pons as he is familiar with your eye exam and can do a comparison with your previous exam.  Please request that he send his records to [place] so we are up-to-date and all on the same page.  Remember, what we had talked about when we initially discussed the diagnosis of pseudotumor cerebri.  The most serious complication of having pseudotumor cerebri is vision loss which can be permanent.   Please continue with zonisamide 50 mg twice daily.  I have renewed your prescription as your previous prescription did not reflect the twice daily dosing.  Follow-up to see Ihor Austin, NP in 6 to 8 months in this clinic.  Call us or email [place] through MyChart with any interim questions or concerns.>>   Of note, she had a brain and orbital MRI with and without contrast through [facility] on 04/19/2022.  Her home sleep test from 05/16/2021 indicated moderate/near severe obstructive sleep apnea with a total AHI of 29.2/hour and O2 nadir of 84%.    Her lumbar puncture from 07/31/2022 showed an opening pressure of 22 cm.  I answered all her questions today and she was in agreement with our plan, she will schedule an appointment to see the nurse practitioner in about 6 to 8 months in this clinic.  I spent 30 minutes in total face-to-face time and in reviewing records during pre-charting, more than 50% of which was spent in counseling and coordination of care, reviewing test results, reviewing medications and treatment regimen and/or in discussing or reviewing the diagnosis of OSA, PTC, the prognosis and treatment options. Pertinent laboratory and imaging test results that were available during this visit with the patient were reviewed by me and considered in my medical decision making (see chart for details).

## 2023-09-30 ENCOUNTER — Other Ambulatory Visit: Payer: Self-pay | Admitting: Internal Medicine

## 2023-09-30 DIAGNOSIS — M62838 Other muscle spasm: Secondary | ICD-10-CM

## 2023-10-11 ENCOUNTER — Encounter: Payer: Self-pay | Admitting: Internal Medicine

## 2023-10-11 DIAGNOSIS — G4700 Insomnia, unspecified: Secondary | ICD-10-CM

## 2023-10-11 MED ORDER — ZOLPIDEM TARTRATE 10 MG PO TABS
10.0000 mg | ORAL_TABLET | Freq: Every evening | ORAL | 1 refills | Status: DC | PRN
Start: 2023-10-11 — End: 2024-02-22

## 2023-11-06 ENCOUNTER — Encounter: Payer: Self-pay | Admitting: Adult Health

## 2023-11-16 ENCOUNTER — Other Ambulatory Visit: Payer: Self-pay | Admitting: Internal Medicine

## 2023-11-16 DIAGNOSIS — F331 Major depressive disorder, recurrent, moderate: Secondary | ICD-10-CM

## 2023-11-19 ENCOUNTER — Other Ambulatory Visit: Payer: Self-pay | Admitting: Internal Medicine

## 2023-11-19 ENCOUNTER — Telehealth: Payer: Self-pay

## 2023-11-19 DIAGNOSIS — F331 Major depressive disorder, recurrent, moderate: Secondary | ICD-10-CM

## 2023-11-19 MED ORDER — BUPROPION HCL ER (XL) 150 MG PO TB24
150.0000 mg | ORAL_TABLET | Freq: Every day | ORAL | 1 refills | Status: DC
Start: 2023-11-19 — End: 2024-04-28

## 2023-11-19 NOTE — Telephone Encounter (Signed)
Copied from CRM [id number]. Topic: Clinical - Prescription Issue >> Nov 16, 2023  1:29 PM Jill Davis wrote: Reason for CRM: Pt states her pharmacy needs a signed authorization in order to fill her prescription refill for buPROPion (WELLBUTRIN XL) 150, pt states she only has 1 dose left pt call back [phone]

## 2023-11-25 ENCOUNTER — Other Ambulatory Visit: Payer: Self-pay | Admitting: Internal Medicine

## 2023-11-25 DIAGNOSIS — M62838 Other muscle spasm: Secondary | ICD-10-CM

## 2023-11-26 ENCOUNTER — Encounter: Payer: Self-pay | Admitting: Internal Medicine

## 2023-12-05 ENCOUNTER — Encounter: Payer: Self-pay | Admitting: Internal Medicine

## 2023-12-05 ENCOUNTER — Ambulatory Visit: Payer: 59 | Admitting: Internal Medicine

## 2023-12-05 VITALS — BP 110/80 | Temp 97.4°F | Wt 292.7 lb

## 2023-12-05 DIAGNOSIS — M62838 Other muscle spasm: Secondary | ICD-10-CM | POA: Diagnosis not present

## 2023-12-05 DIAGNOSIS — M5441 Lumbago with sciatica, right side: Secondary | ICD-10-CM

## 2023-12-05 DIAGNOSIS — M5442 Lumbago with sciatica, left side: Secondary | ICD-10-CM

## 2023-12-05 DIAGNOSIS — R7302 Impaired glucose tolerance (oral): Secondary | ICD-10-CM

## 2023-12-05 LAB — POCT GLYCOSYLATED HEMOGLOBIN (HGB A1C): Hemoglobin A1C: 5.9 % — AB (ref 4.0–5.6)

## 2023-12-05 MED ORDER — CYCLOBENZAPRINE HCL 5 MG PO TABS: 5.0000 mg | ORAL_TABLET | Freq: Every evening | ORAL | 1 refills | Status: DC | PRN

## 2023-12-05 MED ORDER — PREDNISONE 10 MG (21) PO TBPK
ORAL_TABLET | ORAL | 0 refills | Status: DC
Start: 2023-12-05 — End: 2024-04-23

## 2023-12-05 NOTE — Progress Notes (Signed)
 Established Patient Office Visit     CC/Reason for Visit: Low back pain, discuss chronic conditions  HPI: Jill Davis is a 58 y.o. female who is coming in today for the above mentioned reasons.  She has been experiencing lower back pain now for about a month.  No injuries that she can recall.  Pain is in bilateral buttock and extends bilaterally down into her upper thighs.  It gets better after moving a little bit.  She is very concerned about her weight.  Insurance did not cover GLP-1's for her.  She would like to consider bariatric surgery.   Past Medical/Surgical History: Past Medical History:  Diagnosis Date   Allergy    grass    Anemia    Anxiety    Arthritis    knees x 2   and hip on left    Asthma    none since moved to [place]    Depression    GERD (gastroesophageal reflux disease)    PRn Rolaids -  gets with red sauces etc    Heart murmur    as baby    History of substance abuse (HCC)    Insomnia    Morbid obesity (HCC)    Neuromuscular disorder (HCC)    plantar fascitis    Substance abuse (HCC)    Thyroid  disease    currently no meds     Past Surgical History:  Procedure Laterality Date   ABDOMINAL HYSTERECTOMY     BREAST BIOPSY Left 11/21/2015   benign   COLONOSCOPY WITH PROPOFOL  N/A 10/05/2020   Procedure: COLONOSCOPY WITH PROPOFOL ;  Surgeon: Lindle Rhea, MD;  Location: [facility];  Service: Gastroenterology;  Laterality: N/A;   cyst on buttocks      PARTIAL HYSTERECTOMY     prior to abd full hysterectomy   POLYPECTOMY  10/05/2020   Procedure: POLYPECTOMY;  Surgeon: Lindle Rhea, MD;  Location: [facility];  Service: Gastroenterology;;    Social History:  reports that she has quit smoking. She has never used smokeless tobacco. She reports that she does not currently use alcohol. She reports that she does not currently use drugs.  Allergies: Allergies  Allergen Reactions   Celebrex [Celecoxib] Itching   Dicyclomine  Rash     Family History:  Family History  Problem Relation Age of Onset   Diabetes Mother    Hypertension Mother    Sleep apnea Mother    CAD Father    Hypertension Father    Sleep apnea Father    Breast cancer Sister        72s   Sleep apnea Sister    Hypertension Brother    Colon cancer Neg Hx    Colon polyps Neg Hx    Esophageal cancer Neg Hx    Stomach cancer Neg Hx    Rectal cancer Neg Hx      Current Outpatient Medications:    albuterol  (VENTOLIN  HFA) 108 (90 Base) MCG/ACT inhaler, Inhale 2 puffs into the lungs every 6 (six) hours as needed for wheezing or shortness of breath., Disp: 8 g, Rfl: 2   buPROPion  (WELLBUTRIN  XL) 150 MG 24 hr tablet, Take 1 tablet (150 mg total) by mouth daily., Disp: 90 tablet, Rfl: 1   Calcium Carb-Cholecalciferol (CALCIUM 600/VITAMIN D  PO), Take by mouth., Disp: , Rfl:    diphenhydramine-acetaminophen  (TYLENOL  PM) 25-500 MG TABS tablet, Take 2 tablets by mouth at bedtime as needed (sleep.)., Disp: , Rfl:    ibuprofen (ADVIL) 200  MG tablet, Take 1,000 mg by mouth every 8 (eight) hours as needed (for pain.). , Disp: , Rfl:    nystatin  (MYCOSTATIN ) 100000 UNIT/ML suspension, Take 5 mLs (500,000 Units total) by mouth 4 (four) times daily., Disp: 60 mL, Rfl: 0   ofloxacin  (OCUFLOX ) 0.3 % ophthalmic solution, Place 1 drop into the left eye 4 (four) times daily., Disp: 5 mL, Rfl: 0   predniSONE  (STERAPRED UNI-PAK 21 TAB) 10 MG (21) TBPK tablet, Take as directed, Disp: 21 tablet, Rfl: 0   triamcinolone  cream (KENALOG ) 0.1 %, Apply 1 application topically 2 (two) times daily., Disp: 30 g, Rfl: 0   zolpidem  (AMBIEN ) 10 MG tablet, Take 1 tablet (10 mg total) by mouth at bedtime as needed for sleep., Disp: 30 tablet, Rfl: 1   zonisamide  (ZONEGRAN ) 50 MG capsule, Take 1 capsule (50 mg total) by mouth 2 (two) times daily., Disp: 60 capsule, Rfl: 5   cyclobenzaprine  (FLEXERIL ) 5 MG tablet, Take 1-2 tablets (5-10 mg total) by mouth at bedtime as needed for muscle  spasms., Disp: 60 tablet, Rfl: 1  Review of Systems:  Negative unless indicated in HPI.   Physical Exam: Vitals:   12/05/23 1339  BP: 110/80  Temp: (!) 97.4 F (36.3 C)  TempSrc: Oral  Weight: 292 lb 11.2 oz (132.8 kg)    Body mass index is 49.47 kg/m.   Physical Exam Vitals reviewed.  Constitutional:      Appearance: Normal appearance. She is obese.  HENT:     Head: Normocephalic and atraumatic.  Eyes:     Conjunctiva/sclera: Conjunctivae normal.  Skin:    General: Skin is warm and dry.  Neurological:     General: No focal deficit present.     Mental Status: She is alert and oriented to person, place, and time.     Impression and Plan:  Acute bilateral low back pain with bilateral sciatica -     predniSONE ; Take as directed  Dispense: 21 tablet; Refill: 0  IGT (impaired glucose tolerance)  Morbid obesity (HCC) -     Amb Referral to Bariatric Surgery  Muscle spasm -     Cyclobenzaprine  HCl; Take 1-2 tablets (5-10 mg total) by mouth at bedtime as needed for muscle spasms.  Dispense: 60 tablet; Refill: 1   -Low back pain with sciatica is the most likely diagnosis.  Will give her back stretches, prednisone  taper and Flexeril .  She declines physical therapy for now but will consider if pain does not improve. -Referral to bariatric surgery placed. -She has a history of impaired glucose tolerance.  A1c in office today 5.9 is stable.  Time spent:32 minutes reviewing chart, interviewing and examining patient and formulating plan of care.     Marguerita Shih, MD [facility]

## 2023-12-11 ENCOUNTER — Encounter: Payer: Self-pay | Admitting: Internal Medicine

## 2023-12-20 ENCOUNTER — Encounter: Payer: Self-pay | Admitting: Internal Medicine

## 2023-12-20 ENCOUNTER — Ambulatory Visit: Payer: 59 | Admitting: Internal Medicine

## 2023-12-20 VITALS — BP 134/82 | HR 87 | Temp 98.1°F | Resp 18 | Ht 64.5 in | Wt 295.1 lb

## 2023-12-20 DIAGNOSIS — M5442 Lumbago with sciatica, left side: Secondary | ICD-10-CM | POA: Diagnosis not present

## 2023-12-20 DIAGNOSIS — M5441 Lumbago with sciatica, right side: Secondary | ICD-10-CM

## 2023-12-20 NOTE — Progress Notes (Signed)
Established Patient Office Visit     CC/Reason for Visit: Discuss back pain  HPI: Jill Davis is a 58 y.o. female who is coming in today for the above mentioned reasons.  Seen recently on January 15 for low back pain with sciatica.  She was given home back stretches and a prednisone taper and had significant relief while on prednisone.  Her pain has started to return although not to the degree that it was when seen then.   Past Medical/Surgical History: Past Medical History:  Diagnosis Date   Allergy    grass    Anemia    Anxiety    Arthritis    knees x 2   and hip on left    Asthma    none since moved to [place]    Depression    GERD (gastroesophageal reflux disease)    PRn Rolaids -  gets with red sauces etc    Heart murmur    as baby    History of substance abuse (HCC)    Insomnia    Morbid obesity (HCC)    Neuromuscular disorder (HCC)    plantar fascitis    Substance abuse (HCC)    Thyroid disease    currently no meds     Past Surgical History:  Procedure Laterality Date   ABDOMINAL HYSTERECTOMY     BREAST BIOPSY Left 11/21/2015   benign   COLONOSCOPY WITH PROPOFOL N/A 10/05/2020   Procedure: COLONOSCOPY WITH PROPOFOL;  Surgeon: Tressia Danas, MD;  Location: [facility];  Service: Gastroenterology;  Laterality: N/A;   cyst on buttocks      PARTIAL HYSTERECTOMY     prior to abd full hysterectomy   POLYPECTOMY  10/05/2020   Procedure: POLYPECTOMY;  Surgeon: Tressia Danas, MD;  Location: [facility];  Service: Gastroenterology;;    Social History:  reports that she has quit smoking. She has never used smokeless tobacco. She reports that she does not currently use alcohol. She reports that she does not currently use drugs.  Allergies: Allergies  Allergen Reactions   Celebrex [Celecoxib] Itching   Dicyclomine Rash    Family History:  Family History  Problem Relation Age of Onset   Diabetes Mother    Hypertension Mother    Sleep  apnea Mother    CAD Father    Hypertension Father    Sleep apnea Father    Breast cancer Sister        41s   Sleep apnea Sister    Hypertension Brother    Colon cancer Neg Hx    Colon polyps Neg Hx    Esophageal cancer Neg Hx    Stomach cancer Neg Hx    Rectal cancer Neg Hx      Current Outpatient Medications:    albuterol (VENTOLIN HFA) 108 (90 Base) MCG/ACT inhaler, Inhale 2 puffs into the lungs every 6 (six) hours as needed for wheezing or shortness of breath., Disp: 8 g, Rfl: 2   buPROPion (WELLBUTRIN XL) 150 MG 24 hr tablet, Take 1 tablet (150 mg total) by mouth daily., Disp: 90 tablet, Rfl: 1   Calcium Carb-Cholecalciferol (CALCIUM 600/VITAMIN D PO), Take by mouth., Disp: , Rfl:    cyclobenzaprine (FLEXERIL) 5 MG tablet, Take 1-2 tablets (5-10 mg total) by mouth at bedtime as needed for muscle spasms., Disp: 60 tablet, Rfl: 1   diphenhydramine-acetaminophen (TYLENOL PM) 25-500 MG TABS tablet, Take 2 tablets by mouth at bedtime as needed (sleep.)., Disp: , Rfl:  ibuprofen (ADVIL) 200 MG tablet, Take 1,000 mg by mouth every 8 (eight) hours as needed (for pain.). , Disp: , Rfl:    nystatin (MYCOSTATIN) 100000 UNIT/ML suspension, Take 5 mLs (500,000 Units total) by mouth 4 (four) times daily., Disp: 60 mL, Rfl: 0   triamcinolone cream (KENALOG) 0.1 %, Apply 1 application topically 2 (two) times daily., Disp: 30 g, Rfl: 0   zolpidem (AMBIEN) 10 MG tablet, Take 1 tablet (10 mg total) by mouth at bedtime as needed for sleep., Disp: 30 tablet, Rfl: 1   zonisamide (ZONEGRAN) 50 MG capsule, Take 1 capsule (50 mg total) by mouth 2 (two) times daily., Disp: 60 capsule, Rfl: 5   ofloxacin (OCUFLOX) 0.3 % ophthalmic solution, Place 1 drop into the left eye 4 (four) times daily. (Patient not taking: Reported on 12/20/2023), Disp: 5 mL, Rfl: 0   predniSONE (STERAPRED UNI-PAK 21 TAB) 10 MG (21) TBPK tablet, Take as directed (Patient not taking: Reported on 12/20/2023), Disp: 21 tablet, Rfl:  0  Review of Systems:  Negative unless indicated in HPI.   Physical Exam: Vitals:   12/20/23 1450  BP: 134/82  Pulse: 87  Resp: 18  Temp: 98.1 F (36.7 C)  TempSrc: Oral  SpO2: 96%  Weight: 295 lb 2 oz (133.9 kg)  Height: 5' 4.5" (1.638 m)    Body mass index is 49.88 kg/m.    Impression and Plan:  Acute bilateral low back pain with bilateral sciatica -     Ambulatory referral to Physical Therapy  -Start physical therapy referral.  We discussed holding off on prednisone unless pain became significantly worse.  She agrees.   Time spent:21 minutes reviewing chart, interviewing and examining patient and formulating plan of care.     Chaya Jan, MD [facility]

## 2023-12-24 ENCOUNTER — Other Ambulatory Visit: Payer: Self-pay | Admitting: Neurology

## 2024-02-20 ENCOUNTER — Other Ambulatory Visit: Payer: Self-pay | Admitting: Internal Medicine

## 2024-02-20 DIAGNOSIS — M62838 Other muscle spasm: Secondary | ICD-10-CM

## 2024-02-20 DIAGNOSIS — G4700 Insomnia, unspecified: Secondary | ICD-10-CM

## 2024-02-22 ENCOUNTER — Emergency Department (HOSPITAL_COMMUNITY)
Admission: EM | Admit: 2024-02-22 | Discharge: 2024-02-22 | Disposition: A | Discharge status: Home / Self Care | Attending: Emergency Medicine | Admitting: Emergency Medicine

## 2024-02-22 ENCOUNTER — Emergency Department (HOSPITAL_COMMUNITY)

## 2024-02-22 ENCOUNTER — Other Ambulatory Visit: Payer: Self-pay

## 2024-02-22 ENCOUNTER — Encounter (HOSPITAL_COMMUNITY): Payer: Self-pay

## 2024-02-22 DIAGNOSIS — R112 Nausea with vomiting, unspecified: Secondary | ICD-10-CM | POA: Diagnosis not present

## 2024-02-22 DIAGNOSIS — R197 Diarrhea, unspecified: Secondary | ICD-10-CM | POA: Insufficient documentation

## 2024-02-22 DIAGNOSIS — R109 Unspecified abdominal pain: Secondary | ICD-10-CM

## 2024-02-22 DIAGNOSIS — R1032 Left lower quadrant pain: Secondary | ICD-10-CM | POA: Diagnosis not present

## 2024-02-22 LAB — URINALYSIS, ROUTINE W REFLEX MICROSCOPIC
Bacteria, UA: NONE SEEN
Glucose, UA: NEGATIVE mg/dL
Hgb urine dipstick: NEGATIVE
Ketones, ur: 5 mg/dL — AB
Leukocytes,Ua: NEGATIVE
Nitrite: NEGATIVE
Protein, ur: 100 mg/dL — AB
Specific Gravity, Urine: 1.04 — ABNORMAL HIGH (ref 1.005–1.030)
pH: 5 (ref 5.0–8.0)

## 2024-02-22 LAB — BASIC METABOLIC PANEL WITH GFR
Anion gap: 13 (ref 5–15)
BUN: 16 mg/dL (ref 6–20)
CO2: 22 mmol/L (ref 22–32)
Calcium: 10.1 mg/dL (ref 8.9–10.3)
Chloride: 103 mmol/L (ref 98–111)
Creatinine, Ser: 1.14 mg/dL — ABNORMAL HIGH (ref 0.44–1.00)
GFR, Estimated: 56 mL/min — ABNORMAL LOW (ref >60)
Glucose, Bld: 109 mg/dL — ABNORMAL HIGH (ref 70–99)
Potassium: 3.5 mmol/L (ref 3.5–5.1)
Sodium: 138 mmol/L (ref 135–145)

## 2024-02-22 LAB — HEPATIC FUNCTION PANEL
ALT: 34 U/L (ref 0–44)
AST: 31 U/L (ref 15–41)
Albumin: 5.1 g/dL — ABNORMAL HIGH (ref 3.5–5.0)
Alkaline Phosphatase: 134 U/L — ABNORMAL HIGH (ref 38–126)
Bilirubin, Direct: 0.2 mg/dL (ref 0.0–0.2)
Indirect Bilirubin: 0.5 mg/dL (ref 0.3–0.9)
Total Bilirubin: 0.7 mg/dL (ref 0.0–1.2)
Total Protein: 9.4 g/dL — ABNORMAL HIGH (ref 6.5–8.1)

## 2024-02-22 LAB — CBC
HCT: 56.4 % — ABNORMAL HIGH (ref 36.0–46.0)
Hemoglobin: 18.4 g/dL — ABNORMAL HIGH (ref 12.0–15.0)
MCH: 27.3 pg (ref 26.0–34.0)
MCHC: 32.6 g/dL (ref 30.0–36.0)
MCV: 83.7 fL (ref 80.0–100.0)
Platelets: 389 10*3/uL (ref 150–400)
RBC: 6.74 MIL/uL — ABNORMAL HIGH (ref 3.87–5.11)
RDW: 15.8 % — ABNORMAL HIGH (ref 11.5–15.5)
WBC: 11.4 10*3/uL — ABNORMAL HIGH (ref 4.0–10.5)
nRBC: 0 % (ref 0.0–0.2)

## 2024-02-22 LAB — MAGNESIUM: Magnesium: 2.1 mg/dL (ref 1.7–2.4)

## 2024-02-22 LAB — CBG MONITORING, ED: Glucose-Capillary: 100 mg/dL — ABNORMAL HIGH (ref 70–99)

## 2024-02-22 LAB — LIPASE, BLOOD: Lipase: 30 U/L (ref 11–51)

## 2024-02-22 MED ORDER — MORPHINE SULFATE (PF) 4 MG/ML IV SOLN
4.0000 mg | Freq: Once | INTRAVENOUS | Status: AC
  Administered 2024-02-22: 4 mg via INTRAVENOUS
  Filled 2024-02-22: qty 1

## 2024-02-22 MED ORDER — LACTATED RINGERS IV BOLUS
1000.0000 mL | Freq: Once | INTRAVENOUS | Status: AC
  Administered 2024-02-22: 1000 mL via INTRAVENOUS

## 2024-02-22 MED ORDER — HYDROMORPHONE HCL 1 MG/ML IJ SOLN
1.0000 mg | Freq: Once | INTRAMUSCULAR | Status: AC
  Administered 2024-02-22: 1 mg via INTRAVENOUS
  Filled 2024-02-22: qty 1

## 2024-02-22 MED ORDER — LOPERAMIDE HCL 2 MG PO CAPS: 2.0000 mg | ORAL_CAPSULE | Freq: Four times a day (QID) | ORAL | 0 refills | Status: DC | PRN

## 2024-02-22 MED ORDER — ONDANSETRON HCL 4 MG/2ML IJ SOLN
4.0000 mg | Freq: Once | INTRAMUSCULAR | Status: AC
  Administered 2024-02-22: 4 mg via INTRAVENOUS
  Filled 2024-02-22: qty 2

## 2024-02-22 MED ORDER — PANTOPRAZOLE SODIUM 20 MG PO TBEC: 20.0000 mg | DELAYED_RELEASE_TABLET | Freq: Every day | ORAL | 0 refills | Status: DC

## 2024-02-22 MED ORDER — IOHEXOL 300 MG/ML  SOLN
100.0000 mL | Freq: Once | INTRAMUSCULAR | Status: AC | PRN
  Administered 2024-02-22: 100 mL via INTRAVENOUS

## 2024-02-22 NOTE — ED Triage Notes (Signed)
 Pt started weight loss medication 2 weeks ago, since starting medication pt has been dizzy, having N/V/D, abdominal pain. Pt states it has been worsening.

## 2024-02-22 NOTE — ED Provider Notes (Signed)
 Care of patient received from prior provider at 6:56 PM, please see their note for complete H/P and care plan.  Received handoff per ED course.  Clinical Course as of 02/22/24 1856  Fri Feb 22, 2024  1628 Patient signed out to Dr. Doran Durand pending remainder of labs and CT imaging. [VK]  1633 Stable 62 YOF with a chief complaint of abdominal pain.  Recently started on Semaglutide.  Severe Diarrhea. CT pending [CC]    Clinical Course User Index [CC] Glyn Ade, MD [VK] Rexford Maus, DO    Reassessment: Feels improved on reassessment. CT with no focal findings.  Has not had any further diarrheal episodes while waiting here.  Feels better after IV fluids. Patient feels comfortable with outpatient care management follow-up with PCP regarding ongoing medication changes.   Glyn Ade, MD 02/22/24 [phone]

## 2024-02-22 NOTE — ED Notes (Signed)
 Pt has already left/dc by previous shift.

## 2024-02-22 NOTE — ED Provider Notes (Signed)
 [facility] AT [facility] Provider Note   CSN: [id number] Arrival date & time: 02/22/24  1342     History  Chief Complaint  Patient presents with   Abdominal Pain   Diarrhea    Jill Davis is a 58 y.o. female.  Patient is a 58 year old female with a past medical history of obesity and OSA presenting to the emergency department with vomiting and diarrhea.  The patient recently started weight loss injection and states that this past Sunday had her second dose.  She states that over the last week she has had significant amount of diarrhea.  She states that she is gone over 40 times in 1 night.  She states that her stools have been watery to brown.  She states she has had associated nausea with intermittent vomiting.  She states that she has lower abdominal pain that feels like a cramping pain that has been constant though it gets worse with bowel movements.  She states that she has had hot and cold chills but no measured fevers at home.  Denies any recent antibiotic use, hospitalizations, international travel or camping trips.  Denies any known sick contacts.  The history is provided by the patient.  Abdominal Pain Associated symptoms: diarrhea   Diarrhea Associated symptoms: abdominal pain        Home Medications Prior to Admission medications   Medication Sig Start Date End Date Taking? Authorizing Provider  albuterol (VENTOLIN HFA) 108 (90 Base) MCG/ACT inhaler Inhale 2 puffs into the lungs every 6 (six) hours as needed for wheezing or shortness of breath. 10/02/22   Philip Aspen, Limmie Patricia, MD  buPROPion (WELLBUTRIN XL) 150 MG 24 hr tablet Take 1 tablet (150 mg total) by mouth daily. 11/19/23   Philip Aspen, Limmie Patricia, MD  Calcium Carb-Cholecalciferol (CALCIUM 600/VITAMIN D PO) Take by mouth.    [provider]  cyclobenzaprine (FLEXERIL) 5 MG tablet TAKE 1 TABLET BY MOUTH AT BEDTIME AS NEEDED FOR MUSCLE SPASMS. 02/22/24    Philip Aspen, Limmie Patricia, MD  diphenhydramine-acetaminophen (TYLENOL PM) 25-500 MG TABS tablet Take 2 tablets by mouth at bedtime as needed (sleep.).    [provider]  ibuprofen (ADVIL) 200 MG tablet Take 1,000 mg by mouth every 8 (eight) hours as needed (for pain.).     [provider]  nystatin (MYCOSTATIN) 100000 UNIT/ML suspension Take 5 mLs (500,000 Units total) by mouth 4 (four) times daily. 06/04/23   Philip Aspen, Limmie Patricia, MD  ofloxacin (OCUFLOX) 0.3 % ophthalmic solution Place 1 drop into the left eye 4 (four) times daily. Patient not taking: Reported on 12/20/2023 05/12/21   Philip Aspen, Limmie Patricia, MD  predniSONE (STERAPRED UNI-PAK 21 TAB) 10 MG (21) TBPK tablet Take as directed Patient not taking: Reported on 12/20/2023 12/05/23   Philip Aspen, Limmie Patricia, MD  triamcinolone cream (KENALOG) 0.1 % Apply 1 application topically 2 (two) times daily. 05/12/21   Philip Aspen, Limmie Patricia, MD  zolpidem (AMBIEN) 10 MG tablet TAKE 1 TABLET BY MOUTH AT BEDTIME AS NEEDED FOR SLEEP. 02/22/24   Philip Aspen, Limmie Patricia, MD  zonisamide (ZONEGRAN) 50 MG capsule Take 1 capsule (50 mg total) by mouth 2 (two) times daily. 09/12/23   Huston Foley, MD      Allergies    Celebrex [celecoxib] and Dicyclomine    Review of Systems   Review of Systems  Gastrointestinal:  Positive for abdominal pain and diarrhea.    Physical Exam Updated Vital Signs  BP (!) 136/105   Pulse 91   Temp 97.8 F (36.6 C) (Oral)   Resp 20   Ht 5' 4.5" (1.638 m)   Wt 135.6 kg   SpO2 99%   BMI 50.53 kg/m  Physical Exam Vitals and nursing note reviewed.  Constitutional:      General: She is not in acute distress.    Appearance: She is well-developed. She is obese.  HENT:     Head: Normocephalic and atraumatic.     Mouth/Throat:     Pharynx: Oropharynx is clear.     Comments: Dry mucous membranes Eyes:     Extraocular Movements: Extraocular movements intact.  Cardiovascular:     Rate  and Rhythm: Normal rate and regular rhythm.     Heart sounds: Normal heart sounds.  Pulmonary:     Effort: Pulmonary effort is normal.     Breath sounds: Normal breath sounds.  Abdominal:     General: Abdomen is flat.     Palpations: Abdomen is soft.     Tenderness: There is abdominal tenderness in the suprapubic area and left lower quadrant. There is no guarding or rebound.  Skin:    General: Skin is warm and dry.  Neurological:     General: No focal deficit present.     Mental Status: She is alert and oriented to person, place, and time.  Psychiatric:        Mood and Affect: Mood normal.        Behavior: Behavior normal.     ED Results / Procedures / Treatments   Labs (all labs ordered are listed, but only abnormal results are displayed) Labs Reviewed  BASIC METABOLIC PANEL WITH GFR - Abnormal; Notable for the following components:      Result Value   Glucose, Bld 109 (*)    Creatinine, Ser 1.14 (*)    GFR, Estimated 56 (*)    All other components within normal limits  CBC - Abnormal; Notable for the following components:   WBC 11.4 (*)    RBC 6.74 (*)    Hemoglobin 18.4 (*)    HCT 56.4 (*)    RDW 15.8 (*)    All other components within normal limits  CBG MONITORING, ED - Abnormal; Notable for the following components:   Glucose-Capillary 100 (*)    All other components within normal limits  C DIFFICILE QUICK SCREEN W PCR REFLEX    GASTROINTESTINAL PANEL BY PCR, STOOL (REPLACES STOOL CULTURE)  LIPASE, BLOOD  URINALYSIS, ROUTINE W REFLEX MICROSCOPIC  HEPATIC FUNCTION PANEL  MAGNESIUM  CBG MONITORING, ED    EKG EKG Interpretation Date/Time:  Friday February 22 2024 13:53:16 EDT Ventricular Rate:  90 PR Interval:  161 QRS Duration:  89 QT Interval:  375 QTC Calculation: 459 R Axis:   75  Text Interpretation: Sinus rhythm Low voltage, precordial leads Baseline wander in lead(s) V5 No significant change since last tracing Confirmed by Elayne Snare ([id number]) on  02/22/2024 2:29:53 PM  Radiology No results found.  Procedures Procedures    Medications Ordered in ED Medications  lactated ringers bolus 1,000 mL (1,000 mLs Intravenous New Bag/Given 02/22/24 1530)  ondansetron (ZOFRAN) injection 4 mg (4 mg Intravenous Given 02/22/24 1529)  morphine (PF) 4 MG/ML injection 4 mg (4 mg Intravenous Given 02/22/24 1530)  HYDROmorphone (DILAUDID) injection 1 mg (1 mg Intravenous Given 02/22/24 1613)    ED Course/ Medical Decision Making/ A&P Clinical Course as of 02/22/24 1629  Fri Feb 22, 2024  1628 Patient signed out to Dr. Doran Durand pending remainder of labs and CT imaging. [VK]    Clinical Course User Index [VK] Rexford Maus, DO                                 Medical Decision Making This patient presents to the ED with chief complaint(s) of abdominal pain, vomiting, diarrhea with pertinent past medical history of obesity recently started on weight loss medication, OSA which further complicates the presenting complaint. The complaint involves an extensive differential diagnosis and also carries with it a high risk of complications and morbidity.    The differential diagnosis includes dehydration, electrolyte abnormality, gastritis, GERD, gastroenteritis, infectious diarrhea, diverticulitis, no black or bloody stools making GI bleed less likely, medication side effect  Additional history obtained: Additional history obtained from spouse Records reviewed Primary Care Documents  ED Course and Reassessment: On patient's arrival she is hemodynamically stable in no acute distress, is dry appearing.  Patient will have labs performed to evaluate for dehydration or electrolyte derangement, will do stool studies if she has diarrhea here.  Will of CT abdomen and pelvis to evaluate for possible diverticulitis.  Symptomatically will be given medication for pain, nausea and fluids and will be closely reassessed.  Independent labs interpretation:  The  following labs were independently interpreted: likely hemoconcentration with mildly elevated WBC and RBC, BMP normal, labs otherwise pending  Independent visualization of imaging: - pending    Amount and/or Complexity of Data Reviewed Labs: ordered. Radiology: ordered.  Risk Prescription drug management.          Final Clinical Impression(s) / ED Diagnoses Final diagnoses:  Diarrhea, unspecified type    Rx / DC Orders ED Discharge Orders     None         Rexford Maus, DO 02/22/24 1629

## 2024-02-25 ENCOUNTER — Encounter (HOSPITAL_COMMUNITY): Payer: Self-pay

## 2024-02-25 ENCOUNTER — Emergency Department (HOSPITAL_COMMUNITY)
Admission: EM | Admit: 2024-02-25 | Discharge: 2024-02-26 | Disposition: A | Discharge status: Home / Self Care | Attending: Emergency Medicine | Admitting: Emergency Medicine

## 2024-02-25 DIAGNOSIS — R197 Diarrhea, unspecified: Secondary | ICD-10-CM | POA: Diagnosis present

## 2024-02-25 DIAGNOSIS — E860 Dehydration: Secondary | ICD-10-CM | POA: Diagnosis not present

## 2024-02-25 DIAGNOSIS — R112 Nausea with vomiting, unspecified: Secondary | ICD-10-CM | POA: Diagnosis not present

## 2024-02-25 LAB — CBC
HCT: 49.8 % — ABNORMAL HIGH (ref 36.0–46.0)
Hemoglobin: 16.5 g/dL — ABNORMAL HIGH (ref 12.0–15.0)
MCH: 27.5 pg (ref 26.0–34.0)
MCHC: 33.1 g/dL (ref 30.0–36.0)
MCV: 82.9 fL (ref 80.0–100.0)
Platelets: 351 10*3/uL (ref 150–400)
RBC: 6.01 MIL/uL — ABNORMAL HIGH (ref 3.87–5.11)
RDW: 14.7 % (ref 11.5–15.5)
WBC: 16.2 10*3/uL — ABNORMAL HIGH (ref 4.0–10.5)
nRBC: 0 % (ref 0.0–0.2)

## 2024-02-25 LAB — COMPREHENSIVE METABOLIC PANEL WITH GFR
ALT: 33 U/L (ref 0–44)
AST: 25 U/L (ref 15–41)
Albumin: 4 g/dL (ref 3.5–5.0)
Alkaline Phosphatase: 114 U/L (ref 38–126)
Anion gap: 13 (ref 5–15)
BUN: 10 mg/dL (ref 6–20)
CO2: 22 mmol/L (ref 22–32)
Calcium: 9.1 mg/dL (ref 8.9–10.3)
Chloride: 102 mmol/L (ref 98–111)
Creatinine, Ser: 1 mg/dL (ref 0.44–1.00)
GFR, Estimated: >60 mL/min (ref >60)
Glucose, Bld: 111 mg/dL — ABNORMAL HIGH (ref 70–99)
Potassium: 2.9 mmol/L — ABNORMAL LOW (ref 3.5–5.1)
Sodium: 137 mmol/L (ref 135–145)
Total Bilirubin: 0.6 mg/dL (ref 0.0–1.2)
Total Protein: 7.6 g/dL (ref 6.5–8.1)

## 2024-02-25 LAB — LIPASE, BLOOD: Lipase: 34 U/L (ref 11–51)

## 2024-02-25 MED ORDER — POTASSIUM CHLORIDE 10 MEQ/100ML IV SOLN
10.0000 meq | INTRAVENOUS | Status: AC
  Administered 2024-02-25 – 2024-02-26 (×2): 10 meq via INTRAVENOUS
  Filled 2024-02-25 (×2): qty 100

## 2024-02-25 MED ORDER — PROCHLORPERAZINE EDISYLATE 10 MG/2ML IJ SOLN
5.0000 mg | Freq: Once | INTRAMUSCULAR | Status: AC
  Administered 2024-02-25: 5 mg via INTRAVENOUS
  Filled 2024-02-25: qty 2

## 2024-02-25 MED ORDER — LACTATED RINGERS IV BOLUS
1000.0000 mL | Freq: Once | INTRAVENOUS | Status: AC
  Administered 2024-02-25: 1000 mL via INTRAVENOUS

## 2024-02-25 NOTE — ED Notes (Signed)
 [place] PIV placed.

## 2024-02-25 NOTE — ED Provider Notes (Signed)
 [place] EMERGENCY DEPARTMENT AT [facility] Provider Note   CSN: [id number] Arrival date & time: 02/25/24  1743     History  Chief Complaint  Patient presents with   Diarrhea    Jill Davis is a 58 y.o. female.   Diarrhea Presents resents with diarrhea and abdominal pain.  Has had severe symptoms reportedly over the last 9 days.  Seen in the ER for similar symptoms 3 days ago.  Had CT scan at that time was reassuring.  However did have some dehydration that time.  Has not had previous dosing of her weight loss medicine but states that continued symptoms.  States lots of diarrhea.  Also vomiting.     Home Medications Prior to Admission medications   Medication Sig Start Date End Date Taking? Authorizing Provider  albuterol (VENTOLIN HFA) 108 (90 Base) MCG/ACT inhaler Inhale 2 puffs into the lungs every 6 (six) hours as needed for wheezing or shortness of breath. 10/02/22   Philip Aspen, Limmie Patricia, MD  buPROPion (WELLBUTRIN XL) 150 MG 24 hr tablet Take 1 tablet (150 mg total) by mouth daily. 11/19/23   Philip Aspen, Limmie Patricia, MD  Calcium Carb-Cholecalciferol (CALCIUM 600/VITAMIN D PO) Take by mouth.    [provider]  cyclobenzaprine (FLEXERIL) 5 MG tablet TAKE 1 TABLET BY MOUTH AT BEDTIME AS NEEDED FOR MUSCLE SPASMS. 02/22/24   Philip Aspen, Limmie Patricia, MD  diphenhydramine-acetaminophen (TYLENOL PM) 25-500 MG TABS tablet Take 2 tablets by mouth at bedtime as needed (sleep.).    [provider]  ibuprofen (ADVIL) 200 MG tablet Take 1,000 mg by mouth every 8 (eight) hours as needed (for pain.).     [provider]  loperamide (IMODIUM) 2 MG capsule Take 1 capsule (2 mg total) by mouth 4 (four) times daily as needed for diarrhea or loose stools. 02/22/24   Glyn Ade, MD  nystatin (MYCOSTATIN) 100000 UNIT/ML suspension Take 5 mLs (500,000 Units total) by mouth 4 (four) times daily. 06/04/23   Philip Aspen, Limmie Patricia, MD   ofloxacin (OCUFLOX) 0.3 % ophthalmic solution Place 1 drop into the left eye 4 (four) times daily. Patient not taking: Reported on 12/20/2023 05/12/21   Philip Aspen, Limmie Patricia, MD  pantoprazole (PROTONIX) 20 MG tablet Take 1 tablet (20 mg total) by mouth daily for 10 days. 02/22/24 03/03/24  Glyn Ade, MD  predniSONE (STERAPRED UNI-PAK 21 TAB) 10 MG (21) TBPK tablet Take as directed Patient not taking: Reported on 12/20/2023 12/05/23   Philip Aspen, Limmie Patricia, MD  triamcinolone cream (KENALOG) 0.1 % Apply 1 application topically 2 (two) times daily. 05/12/21   Philip Aspen, Limmie Patricia, MD  zolpidem (AMBIEN) 10 MG tablet TAKE 1 TABLET BY MOUTH AT BEDTIME AS NEEDED FOR SLEEP. 02/22/24   Philip Aspen, Limmie Patricia, MD  zonisamide (ZONEGRAN) 50 MG capsule Take 1 capsule (50 mg total) by mouth 2 (two) times daily. 09/12/23   Huston Foley, MD      Allergies    Celebrex [celecoxib] and Dicyclomine    Review of Systems   Review of Systems  Gastrointestinal:  Positive for diarrhea.    Physical Exam Updated Vital Signs BP (!) 135/90 (BP Location: Left Arm)   Pulse 84   Temp 98.1 F (36.7 C) (Oral)   Resp 17   Ht 5' 4.5" (1.638 m)   Wt 135 kg   SpO2 95%   BMI 50.30 kg/m  Physical Exam Vitals and nursing note reviewed.  Eyes:  Pupils: Pupils are equal, round, and reactive to light.  Cardiovascular:     Rate and Rhythm: Normal rate.  Pulmonary:     Breath sounds: No wheezing.  Abdominal:     Tenderness: There is abdominal tenderness.     Comments: Diffuse tenderness.  No hernia palpated.  Musculoskeletal:        General: No tenderness.     Cervical back: Neck supple.  Skin:    General: Skin is warm.  Neurological:     Mental Status: She is alert and oriented to person, place, and time.     ED Results / Procedures / Treatments   Labs (all labs ordered are listed, but only abnormal results are displayed) Labs Reviewed  COMPREHENSIVE METABOLIC PANEL WITH GFR -  Abnormal; Notable for the following components:      Result Value   Potassium 2.9 (*)    Glucose, Bld 111 (*)    All other components within normal limits  CBC - Abnormal; Notable for the following components:   WBC 16.2 (*)    RBC 6.01 (*)    Hemoglobin 16.5 (*)    HCT 49.8 (*)    All other components within normal limits  GASTROINTESTINAL PANEL BY PCR, STOOL (REPLACES STOOL CULTURE)  C DIFFICILE QUICK SCREEN W PCR REFLEX    LIPASE, BLOOD  URINALYSIS, ROUTINE W REFLEX MICROSCOPIC    EKG None  Radiology No results found.  Procedures Procedures    Medications Ordered in ED Medications  potassium chloride 10 mEq in 100 mL IVPB (10 mEq Intravenous New Bag/Given 02/25/24 2242)  lactated ringers bolus 1,000 mL (1,000 mLs Intravenous New Bag/Given 02/25/24 2229)  lactated ringers bolus 1,000 mL (1,000 mLs Intravenous New Bag/Given 02/25/24 2228)  prochlorperazine (COMPAZINE) injection 5 mg (5 mg Intravenous Given 02/25/24 2239)    ED Course/ Medical Decision Making/ A&P                                 Medical Decision Making Amount and/or Complexity of Data Reviewed Labs: ordered.  Risk Prescription drug management.   Patient with nausea vomit diarrhea.  Some abdominal pain.  Recent visit for same.  Has been on Tirzepatide which is a think the most likely cause.  Has hypokalemia.  Has elevated hemoglobin although it is decreased from what it was recently.  Will give fluid boluses.  Will supplement potassium.  Has been given Compazine.  Stool samples have been ordered since is having multiple episodes of diarrhea.  With recent CT scan 3 days ago do not think we need to repeat at this time.  Care will be turned over to Dr. Eudelia Bunch        Final Clinical Impression(s) / ED Diagnoses Final diagnoses:  Nausea vomiting and diarrhea  Dehydration    Rx / DC Orders ED Discharge Orders     None         Benjiman Core, MD 02/25/24 2313

## 2024-02-25 NOTE — ED Triage Notes (Signed)
 Patient began weight loss medication 3/23. Has been having abdominal cramping, vomiting, diarrhea. No appetite and throwing up water.

## 2024-02-26 ENCOUNTER — Ambulatory Visit: Admitting: Internal Medicine

## 2024-02-26 LAB — GASTROINTESTINAL PANEL BY PCR, STOOL (REPLACES STOOL CULTURE)

## 2024-02-26 LAB — C DIFFICILE QUICK SCREEN W PCR REFLEX
C Diff antigen: NEGATIVE
C Diff interpretation: NOT DETECTED
C Diff toxin: NEGATIVE

## 2024-02-26 MED ORDER — POTASSIUM CHLORIDE CRYS ER 20 MEQ PO TBCR: 20.0000 meq | EXTENDED_RELEASE_TABLET | Freq: Every day | ORAL | 0 refills | Status: AC

## 2024-02-26 MED ORDER — HYOSCYAMINE SULFATE 0.125 MG SL SUBL: 0.2500 mg | SUBLINGUAL_TABLET | Freq: Four times a day (QID) | SUBLINGUAL | 0 refills | Status: DC | PRN

## 2024-02-26 MED ORDER — HYOSCYAMINE SULFATE 0.125 MG SL SUBL
0.2500 mg | SUBLINGUAL_TABLET | Freq: Once | SUBLINGUAL | Status: AC
  Administered 2024-02-26: 0.25 mg via SUBLINGUAL
  Filled 2024-02-26: qty 2

## 2024-02-26 MED ORDER — HYOSCYAMINE SULFATE 0.125 MG PO TABS: 0.2500 mg | ORAL_TABLET | Freq: Once | ORAL | Status: DC

## 2024-02-26 MED ORDER — ONDANSETRON 4 MG PO TBDP: 4.0000 mg | ORAL_TABLET | Freq: Three times a day (TID) | ORAL | 0 refills | Status: AC | PRN

## 2024-02-26 NOTE — ED Provider Notes (Signed)
 I assumed care of this patient from previous provider.  Please see their note for further details of history, exam, and MDM.   Briefly patient is a 58 y.o. female who presented with persistent diarrhea attributed to Tirzepatide use.  Patient does have a remote history of colitis.  No recent antibiotic use.  Patient also having decreased appetite and emesis with abdominal cramping. Labs notable for hypokalemia likely from GI losses.  Currently getting repleted IV.  She does have leukocytosis.     Patient is feeling better after IV fluid hydration.  Tolerating p.o.  Given Levsin which improved abdominal cramping.  C. difficile quick screen was negative.  The patient appears reasonably screened and/or stabilized for discharge and I doubt any other medical condition or other [facility] requiring further screening, evaluation, or treatment in the ED at this time. I have discussed the findings, Dx and Tx plan with the patient/family who expressed understanding and agree(s) with the plan. Discharge instructions discussed at length. The patient/family was given strict return precautions who verbalized understanding of the instructions. No further questions at time of discharge.  Disposition: Discharge  Condition: Good  ED Discharge Orders          Ordered    hyoscyamine (LEVSIN/SL) 0.125 MG SL tablet  4 times daily PRN        02/26/24 0422    ondansetron (ZOFRAN-ODT) 4 MG disintegrating tablet  Every 8 hours PRN        02/26/24 0422    potassium chloride SA (KLOR-CON M) 20 MEQ tablet  Daily        02/26/24 0423             Follow Up: Philip Aspen, Limmie Patricia, MD [address] [place] [address] [phone]   As needed             Nira Conn, MD 02/26/24 [phone]

## 2024-02-28 ENCOUNTER — Encounter: Payer: Self-pay | Admitting: Internal Medicine

## 2024-02-28 ENCOUNTER — Ambulatory Visit: Admitting: Internal Medicine

## 2024-02-28 VITALS — BP 144/75 | HR 67 | Temp 98.2°F | Wt 287.6 lb

## 2024-02-28 DIAGNOSIS — Z09 Encounter for follow-up examination after completed treatment for conditions other than malignant neoplasm: Secondary | ICD-10-CM | POA: Diagnosis not present

## 2024-02-28 DIAGNOSIS — R197 Diarrhea, unspecified: Secondary | ICD-10-CM | POA: Diagnosis not present

## 2024-02-28 NOTE — Progress Notes (Signed)
 Established Patient Office Visit     CC/Reason for Visit: ED follow-up  HPI: Jill Davis is a 58 y.o. female who is coming in today for the above mentioned reasons.  She started compounded Zepbound through an Marketing executive.  Received her first dose on March 23 and her second on March 30.  Shortly after the second dose she had significant nausea and vomiting, diarrhea prompting an ED visit on 4/4 where she had a negative CT scan but was found to be very dehydrated and required IV fluids.  She then had to return to the emergency department for another visit on 4/7 for the same reasons.  She is slowly improving.  She has not taking any further Zepbound dosing.   Past Medical/Surgical History: Past Medical History:  Diagnosis Date   Allergy    grass    Anemia    Anxiety    Arthritis    knees x 2   and hip on left    Asthma    none since moved to [place]    Depression    GERD (gastroesophageal reflux disease)    PRn Rolaids -  gets with red sauces etc    Heart murmur    as baby    History of substance abuse (HCC)    Insomnia    Morbid obesity (HCC)    Neuromuscular disorder (HCC)    plantar fascitis    Substance abuse (HCC)    Thyroid disease    currently no meds     Past Surgical History:  Procedure Laterality Date   ABDOMINAL HYSTERECTOMY     BREAST BIOPSY Left 11/21/2015   benign   COLONOSCOPY WITH PROPOFOL N/A 10/05/2020   Procedure: COLONOSCOPY WITH PROPOFOL;  Surgeon: Tressia Danas, MD;  Location: [facility];  Service: Gastroenterology;  Laterality: N/A;   cyst on buttocks      PARTIAL HYSTERECTOMY     prior to abd full hysterectomy   POLYPECTOMY  10/05/2020   Procedure: POLYPECTOMY;  Surgeon: Tressia Danas, MD;  Location: [facility];  Service: Gastroenterology;;    Social History:  reports that she has quit smoking. She has never used smokeless tobacco. She reports that she does not currently use alcohol. She reports that she does not  currently use drugs.  Allergies: Allergies  Allergen Reactions   Celebrex [Celecoxib] Itching   Dicyclomine Rash    Family History:  Family History  Problem Relation Age of Onset   Diabetes Mother    Hypertension Mother    Sleep apnea Mother    CAD Father    Hypertension Father    Sleep apnea Father    Breast cancer Sister        40s   Sleep apnea Sister    Hypertension Brother    Colon cancer Neg Hx    Colon polyps Neg Hx    Esophageal cancer Neg Hx    Stomach cancer Neg Hx    Rectal cancer Neg Hx      Current Outpatient Medications:    albuterol (VENTOLIN HFA) 108 (90 Base) MCG/ACT inhaler, Inhale 2 puffs into the lungs every 6 (six) hours as needed for wheezing or shortness of breath., Disp: 8 g, Rfl: 2   buPROPion (WELLBUTRIN XL) 150 MG 24 hr tablet, Take 1 tablet (150 mg total) by mouth daily., Disp: 90 tablet, Rfl: 1   Calcium Carb-Cholecalciferol (CALCIUM 600/VITAMIN D PO), Take by mouth., Disp: , Rfl:    cyclobenzaprine (FLEXERIL) 5 MG  tablet, TAKE 1 TABLET BY MOUTH AT BEDTIME AS NEEDED FOR MUSCLE SPASMS., Disp: 30 tablet, Rfl: 1   diphenhydramine-acetaminophen (TYLENOL PM) 25-500 MG TABS tablet, Take 2 tablets by mouth at bedtime as needed (sleep.)., Disp: , Rfl:    hyoscyamine (LEVSIN/SL) 0.125 MG SL tablet, Place 2 tablets (0.25 mg total) under the tongue 4 (four) times daily as needed for up to 5 days., Disp: 30 tablet, Rfl: 0   ibuprofen (ADVIL) 200 MG tablet, Take 1,000 mg by mouth every 8 (eight) hours as needed (for pain.). , Disp: , Rfl:    loperamide (IMODIUM) 2 MG capsule, Take 1 capsule (2 mg total) by mouth 4 (four) times daily as needed for diarrhea or loose stools., Disp: 12 capsule, Rfl: 0   nystatin (MYCOSTATIN) 100000 UNIT/ML suspension, Take 5 mLs (500,000 Units total) by mouth 4 (four) times daily., Disp: 60 mL, Rfl: 0   ofloxacin (OCUFLOX) 0.3 % ophthalmic solution, Place 1 drop into the left eye 4 (four) times daily., Disp: 5 mL, Rfl: 0    ondansetron (ZOFRAN-ODT) 4 MG disintegrating tablet, Take 1 tablet (4 mg total) by mouth every 8 (eight) hours as needed for up to 3 days for nausea or vomiting., Disp: 15 tablet, Rfl: 0   pantoprazole (PROTONIX) 20 MG tablet, Take 1 tablet (20 mg total) by mouth daily for 10 days., Disp: 10 tablet, Rfl: 0   potassium chloride SA (KLOR-CON M) 20 MEQ tablet, Take 1 tablet (20 mEq total) by mouth daily for 7 days., Disp: 7 tablet, Rfl: 0   predniSONE (STERAPRED UNI-PAK 21 TAB) 10 MG (21) TBPK tablet, Take as directed, Disp: 21 tablet, Rfl: 0   triamcinolone cream (KENALOG) 0.1 %, Apply 1 application topically 2 (two) times daily., Disp: 30 g, Rfl: 0   zolpidem (AMBIEN) 10 MG tablet, TAKE 1 TABLET BY MOUTH AT BEDTIME AS NEEDED FOR SLEEP., Disp: 30 tablet, Rfl: 1   zonisamide (ZONEGRAN) 50 MG capsule, Take 1 capsule (50 mg total) by mouth 2 (two) times daily., Disp: 60 capsule, Rfl: 5  Review of Systems:  Negative unless indicated in HPI.   Physical Exam: Vitals:   02/28/24 1530 02/28/24 1531  BP: (!) 150/60 (!) 144/75  Pulse: 67   Temp: 98.2 F (36.8 C)   TempSrc: Oral   SpO2: 97%   Weight: 287 lb 9.6 oz (130.5 kg)     Body mass index is 48.6 kg/m.     Impression and Plan:  Hospital discharge follow-up  Diarrhea, unspecified type   -Abdominal pain is improving, diarrhea is improving as well. -Suspect this is all related to GLP-1/GIP medication.  Should continue to improve now that she is off of it.  Time spent:30 minutes reviewing chart, interviewing and examining patient and formulating plan of care.     Chaya Jan, MD [place] [facility]

## 2024-04-01 ENCOUNTER — Ambulatory Visit: Payer: 59 | Admitting: Adult Health

## 2024-04-08 ENCOUNTER — Encounter: Payer: Self-pay | Admitting: Internal Medicine

## 2024-04-08 NOTE — Telephone Encounter (Signed)
 Appointment has been scheduled.

## 2024-04-17 ENCOUNTER — Other Ambulatory Visit: Payer: Self-pay | Admitting: Neurology

## 2024-04-17 NOTE — Telephone Encounter (Signed)
 Last seen on 09/12/23 No follow up scheduled  My chart message sent in this encounter to schedule a follow up visit.

## 2024-04-23 ENCOUNTER — Other Ambulatory Visit: Payer: Self-pay | Admitting: Internal Medicine

## 2024-04-23 DIAGNOSIS — M5441 Lumbago with sciatica, right side: Secondary | ICD-10-CM

## 2024-04-23 MED ORDER — PREDNISONE 10 MG (21) PO TBPK: ORAL_TABLET | ORAL | 0 refills | Status: DC

## 2024-04-25 ENCOUNTER — Other Ambulatory Visit: Payer: Self-pay | Admitting: Internal Medicine

## 2024-04-25 DIAGNOSIS — G4700 Insomnia, unspecified: Secondary | ICD-10-CM

## 2024-04-27 ENCOUNTER — Other Ambulatory Visit: Payer: Self-pay | Admitting: Internal Medicine

## 2024-04-27 DIAGNOSIS — F331 Major depressive disorder, recurrent, moderate: Secondary | ICD-10-CM

## 2024-04-27 DIAGNOSIS — M62838 Other muscle spasm: Secondary | ICD-10-CM

## 2024-05-25 ENCOUNTER — Other Ambulatory Visit: Payer: Self-pay | Admitting: Family Medicine

## 2024-05-25 DIAGNOSIS — M62838 Other muscle spasm: Secondary | ICD-10-CM

## 2024-05-27 MED ORDER — CYCLOBENZAPRINE HCL 5 MG PO TABS: 5.0000 mg | ORAL_TABLET | Freq: Every day | ORAL | 0 refills | Status: DC

## 2024-05-27 NOTE — Addendum Note (Signed)
 Addended by: KATHRYNE MILLMAN B on: 05/27/2024 08:51 AM   Modules accepted: Orders

## 2024-05-29 ENCOUNTER — Other Ambulatory Visit: Payer: Self-pay | Admitting: Adult Health

## 2024-05-29 DIAGNOSIS — G4700 Insomnia, unspecified: Secondary | ICD-10-CM

## 2024-05-29 NOTE — Telephone Encounter (Signed)
 Okay for refill?

## 2024-06-03 ENCOUNTER — Other Ambulatory Visit: Payer: Self-pay | Admitting: Internal Medicine

## 2024-06-03 DIAGNOSIS — G4700 Insomnia, unspecified: Secondary | ICD-10-CM

## 2024-06-03 MED ORDER — ZOLPIDEM TARTRATE 10 MG PO TABS: 10.0000 mg | ORAL_TABLET | Freq: Every evening | ORAL | 0 refills | Status: DC | PRN

## 2024-06-26 ENCOUNTER — Other Ambulatory Visit: Payer: Self-pay | Admitting: Internal Medicine

## 2024-06-26 DIAGNOSIS — Z1231 Encounter for screening mammogram for malignant neoplasm of breast: Secondary | ICD-10-CM

## 2024-06-27 ENCOUNTER — Other Ambulatory Visit: Payer: Self-pay | Admitting: Internal Medicine

## 2024-06-27 DIAGNOSIS — M62838 Other muscle spasm: Secondary | ICD-10-CM

## 2024-07-23 ENCOUNTER — Ambulatory Visit: Admitting: Internal Medicine

## 2024-07-23 ENCOUNTER — Encounter: Payer: Self-pay | Admitting: Internal Medicine

## 2024-07-23 VITALS — BP 120/70 | Temp 98.3°F | Wt 292.1 lb

## 2024-07-23 DIAGNOSIS — G4700 Insomnia, unspecified: Secondary | ICD-10-CM | POA: Diagnosis not present

## 2024-07-23 DIAGNOSIS — H1032 Unspecified acute conjunctivitis, left eye: Secondary | ICD-10-CM

## 2024-07-23 DIAGNOSIS — F331 Major depressive disorder, recurrent, moderate: Secondary | ICD-10-CM

## 2024-07-23 DIAGNOSIS — G4733 Obstructive sleep apnea (adult) (pediatric): Secondary | ICD-10-CM

## 2024-07-23 DIAGNOSIS — B370 Candidal stomatitis: Secondary | ICD-10-CM

## 2024-07-23 MED ORDER — OFLOXACIN 0.3 % OP SOLN: 1.0000 [drp] | Freq: Four times a day (QID) | OPHTHALMIC | 0 refills | Status: AC

## 2024-07-23 MED ORDER — NYSTATIN 100000 UNIT/ML MT SUSP: 5.0000 mL | Freq: Four times a day (QID) | OROMUCOSAL | 2 refills | Status: DC

## 2024-07-23 MED ORDER — SERTRALINE HCL 25 MG PO TABS: 25.0000 mg | ORAL_TABLET | Freq: Every day | ORAL | 3 refills | Status: DC

## 2024-07-23 NOTE — Progress Notes (Signed)
 Established Patient Office Visit     CC/Reason for Visit: Discuss chronic and acute concerns  HPI: Jill Davis is a 58 y.o. female who is coming in today for the above mentioned reasons.  She is here today to discuss a few main issues:  1.  She is still having severe difficulty sleeping.  She has been diagnosed with OSA and is not fully adherent to CPAP therapy.  She has been taking Ambien  10 mg.  In addition she has lately had a lot of social stressors with the severe illness of her mother.  2.  She has been having significant dry mouth.  She recently had a URI.  The last time she had this dry mouth that she had thrush and wonders if she has the same.  3.  Has been dealing with redness and discharge of her left eye.  She has used ofloxacin  drops in the past with relief.   Past Medical/Surgical History: Past Medical History:  Diagnosis Date   Allergy    grass    Anemia    Anxiety    Arthritis    knees x 2   and hip on left    Asthma    none since moved to [place]    Depression    GERD (gastroesophageal reflux disease)    PRn Rolaids -  gets with red sauces etc    Heart murmur    as baby    History of substance abuse (HCC)    Insomnia    Morbid obesity (HCC)    Neuromuscular disorder (HCC)    plantar fascitis    Substance abuse (HCC)    Thyroid  disease    currently no meds     Past Surgical History:  Procedure Laterality Date   ABDOMINAL HYSTERECTOMY     BREAST BIOPSY Left 11/21/2015   benign   COLONOSCOPY WITH PROPOFOL  N/A 10/05/2020   Procedure: COLONOSCOPY WITH PROPOFOL ;  Surgeon: Eda Iha, MD;  Location: [facility];  Service: Gastroenterology;  Laterality: N/A;   cyst on buttocks      PARTIAL HYSTERECTOMY     prior to abd full hysterectomy   POLYPECTOMY  10/05/2020   Procedure: POLYPECTOMY;  Surgeon: Eda Iha, MD;  Location: [facility];  Service: Gastroenterology;;    Social History:  reports that she has quit smoking. She  has never used smokeless tobacco. She reports that she does not currently use alcohol. She reports that she does not currently use drugs.  Allergies: Allergies  Allergen Reactions   Celebrex [Celecoxib] Itching   Dicyclomine  Rash    Family History:  Family History  Problem Relation Age of Onset   Diabetes Mother    Hypertension Mother    Sleep apnea Mother    CAD Father    Hypertension Father    Sleep apnea Father    Breast cancer Sister        48s   Sleep apnea Sister    Hypertension Brother    Colon cancer Neg Hx    Colon polyps Neg Hx    Esophageal cancer Neg Hx    Stomach cancer Neg Hx    Rectal cancer Neg Hx      Current Outpatient Medications:    albuterol  (VENTOLIN  HFA) 108 (90 Base) MCG/ACT inhaler, Inhale 2 puffs into the lungs every 6 (six) hours as needed for wheezing or shortness of breath., Disp: 8 g, Rfl: 2   buPROPion  (WELLBUTRIN  XL) 150 MG 24 hr tablet, TAKE  1 TABLET BY MOUTH EVERY DAY, Disp: 90 tablet, Rfl: 0   cyclobenzaprine  (FLEXERIL ) 5 MG tablet, TAKE 1 TABLET BY MOUTH EVERYDAY AT BEDTIME, Disp: 30 tablet, Rfl: 0   diphenhydramine-acetaminophen  (TYLENOL  PM) 25-500 MG TABS tablet, Take 2 tablets by mouth at bedtime as needed (sleep.)., Disp: , Rfl:    ibuprofen (ADVIL) 200 MG tablet, Take 1,000 mg by mouth every 8 (eight) hours as needed (for pain.). , Disp: , Rfl:    loperamide  (IMODIUM ) 2 MG capsule, Take 1 capsule (2 mg total) by mouth 4 (four) times daily as needed for diarrhea or loose stools., Disp: 12 capsule, Rfl: 0   pantoprazole  (PROTONIX ) 20 MG tablet, Take 1 tablet (20 mg total) by mouth daily for 10 days., Disp: 10 tablet, Rfl: 0   sertraline  (ZOLOFT ) 25 MG tablet, Take 1 tablet (25 mg total) by mouth daily., Disp: 30 tablet, Rfl: 3   triamcinolone  cream (KENALOG ) 0.1 %, Apply 1 application topically 2 (two) times daily., Disp: 30 g, Rfl: 0   zolpidem  (AMBIEN ) 10 MG tablet, Take 1 tablet (10 mg total) by mouth at bedtime as needed. for sleep,  Disp: 30 tablet, Rfl: 0   Calcium Carb-Cholecalciferol (CALCIUM 600/VITAMIN D  PO), Take by mouth., Disp: , Rfl:    hyoscyamine  (LEVSIN /SL) 0.125 MG SL tablet, Place 2 tablets (0.25 mg total) under the tongue 4 (four) times daily as needed for up to 5 days., Disp: 30 tablet, Rfl: 0   nystatin  (MYCOSTATIN ) 100000 UNIT/ML suspension, Take 5 mLs (500,000 Units total) by mouth 4 (four) times daily., Disp: 60 mL, Rfl: 2   ofloxacin  (OCUFLOX ) 0.3 % ophthalmic solution, Place 1 drop into the left eye 4 (four) times daily., Disp: 5 mL, Rfl: 0   predniSONE  (STERAPRED UNI-PAK 21 TAB) 10 MG (21) TBPK tablet, Take as directed, Disp: 21 tablet, Rfl: 0   zonisamide  (ZONEGRAN ) 50 MG capsule, TAKE 1 CAPSULE BY MOUTH 2 TIMES DAILY., Disp: 180 capsule, Rfl: 0  Review of Systems:  Negative unless indicated in HPI.   Physical Exam: Vitals:   07/23/24 0706  BP: 120/70  Temp: 98.3 F (36.8 C)  TempSrc: Oral  Weight: 292 lb 1.6 oz (132.5 kg)    Body mass index is 49.36 kg/m.   Physical Exam Vitals reviewed.  Constitutional:      Appearance: Normal appearance. She is obese.  HENT:     Head: Normocephalic and atraumatic.  Eyes:     General:        Left eye: Discharge present.    Conjunctiva/sclera: Conjunctivae normal.  Cardiovascular:     Rate and Rhythm: Normal rate and regular rhythm.  Pulmonary:     Effort: Pulmonary effort is normal.     Breath sounds: Normal breath sounds.  Skin:    General: Skin is warm and dry.  Neurological:     General: No focal deficit present.     Mental Status: She is alert and oriented to person, place, and time.  Psychiatric:        Mood and Affect: Mood normal.        Behavior: Behavior normal.        Thought Content: Thought content normal.        Judgment: Judgment normal.     Flowsheet Row Office Visit from 07/23/2024 in [facility] [place]  PHQ-9 Total Score 15    Impression and Plan:  Insomnia, unspecified type Assessment  & Plan: Suspect this is a multi component.  Suspect her main issue is sleep apnea.  She does not wear CPAP consistently.  Suspect also anxiety playing a role.  Hopefully sertraline  will help.  She is prescribed Ambien  10 mg.   Morbid obesity (HCC) Assessment & Plan: -Discussed healthy lifestyle, including increased physical activity and better food choices to promote weight loss. - She did not tolerate GLP-1's, her insurance apparently does not cover weight loss surgery.   OSA (obstructive sleep apnea) Assessment & Plan: Not very compliant with CPAP therapy.   Moderate episode of recurrent major depressive disorder (HCC) Assessment & Plan: Not well-controlled, with a component of anxiety as well.  Continue bupropion  150 mg.  Add sertraline  25 mg daily.  We discussed CBT therapy.  Orders: -     Sertraline  HCl; Take 1 tablet (25 mg total) by mouth daily.  Dispense: 30 tablet; Refill: 3  Acute conjunctivitis of left eye, unspecified acute conjunctivitis type -     Ofloxacin ; Place 1 drop into the left eye 4 (four) times daily.  Dispense: 5 mL; Refill: 0  Thrush, oral -     Nystatin ; Take 5 mLs (500,000 Units total) by mouth 4 (four) times daily.  Dispense: 60 mL; Refill: 2  - Nystatin  suspension for oral thrush. - Ofloxacin  for acute conjunctivitis of her left eye.   Time spent:33 minutes reviewing chart, interviewing and examining patient and formulating plan of care.     Tully Theophilus Andrews, MD [place] [facility] at [facility]

## 2024-07-23 NOTE — Assessment & Plan Note (Signed)
 Not very compliant with CPAP therapy.

## 2024-07-23 NOTE — Assessment & Plan Note (Signed)
 Not well-controlled, with a component of anxiety as well.  Continue bupropion  150 mg.  Add sertraline  25 mg daily.  We discussed CBT therapy.

## 2024-07-23 NOTE — Assessment & Plan Note (Signed)
-  Discussed healthy lifestyle, including increased physical activity and better food choices to promote weight loss. - She did not tolerate GLP-1's, her insurance apparently does not cover weight loss surgery.

## 2024-07-23 NOTE — Assessment & Plan Note (Signed)
 Suspect this is a multi component.  Suspect her main issue is sleep apnea.  She does not wear CPAP consistently.  Suspect also anxiety playing a role.  Hopefully sertraline  will help.  She is prescribed Ambien  10 mg.

## 2024-07-24 ENCOUNTER — Other Ambulatory Visit: Payer: Self-pay | Admitting: Family Medicine

## 2024-07-24 DIAGNOSIS — F331 Major depressive disorder, recurrent, moderate: Secondary | ICD-10-CM

## 2024-07-24 NOTE — Telephone Encounter (Signed)
 This is Dr. Dennard patient, looks like I just filled this when I was covering for her-- when will she be back in the office?

## 2024-07-25 ENCOUNTER — Other Ambulatory Visit: Payer: Self-pay | Admitting: Internal Medicine

## 2024-07-25 DIAGNOSIS — M62838 Other muscle spasm: Secondary | ICD-10-CM

## 2024-07-28 ENCOUNTER — Encounter: Payer: Self-pay | Admitting: Neurology

## 2024-07-29 ENCOUNTER — Other Ambulatory Visit: Payer: Self-pay | Admitting: Neurology

## 2024-07-29 ENCOUNTER — Other Ambulatory Visit: Payer: Self-pay | Admitting: Internal Medicine

## 2024-07-29 DIAGNOSIS — M62838 Other muscle spasm: Secondary | ICD-10-CM

## 2024-07-29 MED ORDER — CYCLOBENZAPRINE HCL 5 MG PO TABS: 5.0000 mg | ORAL_TABLET | Freq: Every evening | ORAL | 0 refills | Status: DC | PRN

## 2024-07-29 NOTE — Telephone Encounter (Signed)
 Patient called to schedule appointment with Harlene.

## 2024-08-08 ENCOUNTER — Other Ambulatory Visit: Payer: Self-pay | Admitting: Internal Medicine

## 2024-08-08 DIAGNOSIS — G4700 Insomnia, unspecified: Secondary | ICD-10-CM

## 2024-08-11 ENCOUNTER — Other Ambulatory Visit: Payer: Self-pay | Admitting: Internal Medicine

## 2024-08-11 DIAGNOSIS — M5441 Lumbago with sciatica, right side: Secondary | ICD-10-CM

## 2024-08-11 MED ORDER — PREDNISONE 10 MG (21) PO TBPK: ORAL_TABLET | ORAL | 0 refills | Status: DC

## 2024-08-13 ENCOUNTER — Ambulatory Visit

## 2024-08-13 ENCOUNTER — Ambulatory Visit
Admission: RE | Admit: 2024-08-13 | Discharge: 2024-08-13 | Disposition: A | Discharge status: Home / Self Care | Source: Ambulatory Visit | Attending: Internal Medicine | Admitting: Internal Medicine

## 2024-08-13 DIAGNOSIS — Z1231 Encounter for screening mammogram for malignant neoplasm of breast: Secondary | ICD-10-CM

## 2024-08-15 ENCOUNTER — Other Ambulatory Visit: Payer: Self-pay | Admitting: Internal Medicine

## 2024-08-15 DIAGNOSIS — F331 Major depressive disorder, recurrent, moderate: Secondary | ICD-10-CM

## 2024-08-22 ENCOUNTER — Ambulatory Visit: Payer: Self-pay | Admitting: Internal Medicine

## 2024-08-22 ENCOUNTER — Telehealth: Admitting: Nurse Practitioner

## 2024-08-22 DIAGNOSIS — B370 Candidal stomatitis: Secondary | ICD-10-CM | POA: Diagnosis not present

## 2024-08-22 MED ORDER — NYSTATIN 100000 UNIT/ML MT SUSP: 5.0000 mL | Freq: Four times a day (QID) | OROMUCOSAL | 0 refills | Status: DC

## 2024-08-22 NOTE — Telephone Encounter (Signed)
 FYI Only or Action Required?: Action required by provider: request for appointment and medication refill request.  Patient was last seen in primary care on 07/23/2024 by Theophilus Andrews, Tully GRADE, MD.  Called Nurse Triage reporting Mouth Pain.  Symptoms began several days ago.  Interventions attempted: Prescription medications: Nystatin , patient is out.  Symptoms are: unchanged.  Triage Disposition: See PCP When Office is Open (Within 3 Days)  Patient/caregiver understands and will follow disposition?: Yes Reason for Disposition  [1] MILD-MODERATE mouth pain AND [2] present > 3 days  Answer Assessment - Initial Assessment Questions Patient states PCP knows, this keeps happening, cannot figuring out what is going on. Said she sent MyChart message to PCP for refill on Nystatin  today. Patient is completely out of medication and requesting a stat refill sent to  [facility] on file. Requesting call back [phone]  1. ONSET: When did your mouth start hurting? (e.g., hours or days ago)      Started back 2 days ago, on going  2. SEVERITY: How bad is the pain? (Scale 1-10; or mild, moderate or severe)     Moderate to severe  3. SORES: Are there any sores or ulcers in the mouth? If Yes, ask: What part of the mouth are the sores in?     Thrush, white on tongue  4. FEVER: Do you have a fever? If Yes, ask: What is your temperature, how was it measured, and when did it start?     No  5. CAUSE: What do you think is causing the mouth pain?     Thrush  6. OTHER SYMPTOMS: Do you have any other symptoms? (e.g., difficulty breathing)     Sick to stomach  Protocols used: Mouth Pain-A-AH  Copied from CRM #[id number]. Topic: Clinical - Red Word Triage >> Aug 22, 2024 12:38 PM Rea BROCKS wrote: Red Word that prompted transfer to Nurse Triage:  Thrush- mouth is burning, whole tongue is burning and sharp pain, white, and skin is coming off.

## 2024-08-22 NOTE — Progress Notes (Signed)
 E-Visit Treatment for Thrush symptoms  We are sorry that you are not feeling well. Here is how we plan to help!  Based on what you have shared with me, it appears that you have Thrush.  Celestino is a fungal (yeast) infection that can grow in your mouth, throat, and other parts of your body. With oral thrush (oral candidiasis), you may develop white, raised, cottage cheese-like lesions (spots) on your tongue and cheeks. Celestino can quickly become irritated and cause mouth pain and redness. Thrush usually develops suddenly. A common sign is the presence of creamy white, slightly raised lesions in your mouth -- usually on your tongue or inner cheeks. You may also have lesions on the roof of your mouth, gums, tonsils, or back of your throat.   Other symptoms may include: Redness and soreness inside and at the corners of your mouth Loss of sense of taste ([place]) Cottony feeling in your mouth  The lesions can hurt and may bleed a little when you scrape them or brush your teeth. In severe cases, the lesions can spread into your esophagus and cause: Pain or difficulty swallowing A feeling that food gets stuck in your throat or mid-chest area Fever, if the infection spreads beyond your esophagus  Most people have small amounts of the Candida fungus in their mouth, digestive tract and skin. When illnesses, stress or medications disturb this balance, the fungus grows out of control and causes thrush.  Medications that can make yeast flourish and cause infection include: Corticosteroids Inhalers Antibiotics Birth Control Pills  Celestino can be contagious to those at risk (like people with weakened immune systems or who take certain medications). In people with healthy immune systems, it's unusual to pass thrush through kissing or other close contact. In most cases, thrush isn't particularly contagious (meaning, it doesn't spread from person to person), but it is transmittable (meaning, you can catch it  in other ways, like through saliva when you are immunocompromised).  I have prescribed I have prescribed an antifungal - Nystatin  suspension 100,000 units/mL Swish and swallow 5mL every 6 hours for up to 10-14 days and Antifungals can clear up thrush in one to two weeks. You may need to continue the medication for a few more days to kill any fungus that's left behind.  Prevention: Practice good oral hygiene. See your dentist regularly. This is especially important if you have diabetes or wear dentures. Limit the amount of sugar and yeast-containing foods you eat. Foods such as bread, beer and wine encourage Candida growth. Avoid smoking and other tobacco use. Ask your healthcare provider about ways to help you quit smoking (We do offer a smoking cessation program through the [facility] that you can schedule on your time through MyChart). With treatment, thrush usually goes away within one to two weeks. But if your symptoms linger or get worse, please seek in-person evaluation.  Home Care: Swish with warm saltwater. Take probiotics. Eat yogurt that contains healthy bacteria.  GET HELP RIGHT AWAY IF: Ulcers that are spreading, are very large or particularly painful Ulcers last longer than one week without improving on treatment If you develop a fever, swollen glands and begin to feel unwell  MAKE SURE YOU: Understand these instructions Will watch your condition. Will get help right away if you are not doing well or get worse.  Thank you for choosing an e-visit!  Your e-visit answers were reviewed by a board certified advanced clinical practitioner to complete your personal care  plan. Depending upon the condition, your plan could have included both over the counter or prescription medications.  Please review your pharmacy choice. Make sure the pharmacy is open so you can pick up prescription now. If there is a problem, you may contact your provider through [employer] and have the prescription routed to another pharmacy. Your safety is important to us . If you have drug allergies, check your prescription carefully.  For the next 24 hours you can use MyChart to ask questions about today's visit, request a non-urgent call back, or ask for a work or school excuse.  You will get an email in the next two days asking about your experience. I hope that your e-visit has been valuable and will speed your recovery.   I have spent 5 minutes in review of e-visit questionnaire, review and updating patient chart, medical decision making and response to patient.   Delon CHRISTELLA Dickinson, PA-C

## 2024-08-24 ENCOUNTER — Other Ambulatory Visit: Payer: Self-pay | Admitting: Internal Medicine

## 2024-08-24 DIAGNOSIS — M62838 Other muscle spasm: Secondary | ICD-10-CM

## 2024-08-25 NOTE — Telephone Encounter (Signed)
 Answered in Mychart note.

## 2024-08-26 ENCOUNTER — Ambulatory Visit: Admitting: Internal Medicine

## 2024-09-10 ENCOUNTER — Other Ambulatory Visit: Payer: Self-pay | Admitting: *Deleted

## 2024-09-10 ENCOUNTER — Other Ambulatory Visit: Payer: Self-pay | Admitting: Internal Medicine

## 2024-09-10 DIAGNOSIS — B370 Candidal stomatitis: Secondary | ICD-10-CM

## 2024-09-10 DIAGNOSIS — G4700 Insomnia, unspecified: Secondary | ICD-10-CM

## 2024-09-10 MED ORDER — NYSTATIN 100000 UNIT/ML MT SUSP: 5.0000 mL | Freq: Four times a day (QID) | OROMUCOSAL | 0 refills | Status: AC

## 2024-09-21 ENCOUNTER — Other Ambulatory Visit: Payer: Self-pay | Admitting: Internal Medicine

## 2024-09-21 DIAGNOSIS — M62838 Other muscle spasm: Secondary | ICD-10-CM

## 2024-10-03 ENCOUNTER — Ambulatory Visit: Admitting: Adult Health

## 2024-10-11 ENCOUNTER — Other Ambulatory Visit: Payer: Self-pay | Admitting: Internal Medicine

## 2024-10-11 DIAGNOSIS — G4700 Insomnia, unspecified: Secondary | ICD-10-CM

## 2024-10-13 ENCOUNTER — Other Ambulatory Visit: Payer: Self-pay | Admitting: Internal Medicine

## 2024-10-13 ENCOUNTER — Encounter: Payer: Self-pay | Admitting: Internal Medicine

## 2024-10-13 DIAGNOSIS — G4700 Insomnia, unspecified: Secondary | ICD-10-CM

## 2024-10-13 MED ORDER — ZOLPIDEM TARTRATE 10 MG PO TABS: 10.0000 mg | ORAL_TABLET | Freq: Every evening | ORAL | 0 refills | Status: DC | PRN

## 2024-10-19 ENCOUNTER — Other Ambulatory Visit: Payer: Self-pay | Admitting: Internal Medicine

## 2024-10-19 DIAGNOSIS — M62838 Other muscle spasm: Secondary | ICD-10-CM

## 2024-10-23 ENCOUNTER — Encounter: Payer: Self-pay | Admitting: Internal Medicine

## 2024-10-23 ENCOUNTER — Ambulatory Visit: Admitting: Internal Medicine

## 2024-10-23 ENCOUNTER — Other Ambulatory Visit: Payer: Self-pay | Admitting: Neurology

## 2024-10-23 VITALS — BP 110/74 | Wt 284.5 lb

## 2024-10-23 DIAGNOSIS — B372 Candidiasis of skin and nail: Secondary | ICD-10-CM

## 2024-10-23 DIAGNOSIS — M7918 Myalgia, other site: Secondary | ICD-10-CM

## 2024-10-23 DIAGNOSIS — M5441 Lumbago with sciatica, right side: Secondary | ICD-10-CM

## 2024-10-23 MED ORDER — PREDNISONE 10 MG (21) PO TBPK: ORAL_TABLET | ORAL | 0 refills | Status: DC

## 2024-10-23 MED ORDER — NYSTATIN 100000 UNIT/GM EX POWD: 1.0000 | Freq: Three times a day (TID) | CUTANEOUS | 3 refills | Status: AC

## 2024-10-23 MED ORDER — KETOROLAC TROMETHAMINE 60 MG/2ML IM SOLN
60.0000 mg | Freq: Once | INTRAMUSCULAR | Status: AC
  Administered 2024-10-23: 60 mg via INTRAMUSCULAR

## 2024-10-23 NOTE — Progress Notes (Signed)
 Established Patient Office Visit     CC/Reason for Visit: Bilateral buttock pain, rash under breasts  HPI: Jill Davis is a 58 y.o. female who is coming in today for the above mentioned reasons.  She has been dealing with presumptive bilateral sciatica now for over 6 months.  She attended a couple sessions of physical therapy.  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  She has lost approximately 15 to 20 pounds.  She has had an itching, moist rash under bilateral breasts as well.  Bilateral buttock pain has become worse.   Past Medical/Surgical History: Past Medical History:  Diagnosis Date   Allergy    grass    Anemia    Anxiety    Arthritis    knees x 2   and hip on left    Asthma    none since moved to [place]    Depression    GERD (gastroesophageal reflux disease)    PRn Rolaids -  gets with red sauces etc    Heart murmur    as baby    History of substance abuse (HCC)    Insomnia    Morbid obesity (HCC)    Neuromuscular disorder (HCC)    plantar fascitis    Substance abuse (HCC)    Thyroid  disease    currently no meds     Past Surgical History:  Procedure Laterality Date   ABDOMINAL HYSTERECTOMY     BREAST BIOPSY Left 11/21/2015   benign   COLONOSCOPY WITH PROPOFOL  N/A 10/05/2020   Procedure: COLONOSCOPY WITH PROPOFOL ;  Surgeon: Eda Iha, MD;  Location: [facility];  Service: Gastroenterology;  Laterality: N/A;   cyst on buttocks      PARTIAL HYSTERECTOMY     prior to abd full hysterectomy   POLYPECTOMY  10/05/2020   Procedure: POLYPECTOMY;  Surgeon: Eda Iha, MD;  Location: [facility];  Service: Gastroenterology;;    Social History:  reports that she has quit smoking. She has never used smokeless tobacco. She reports that she does not currently use alcohol. She reports that she does not currently use drugs.  Allergies: Allergies  Allergen Reactions   Celebrex [Celecoxib] Itching    Dicyclomine  Rash    Family History:  Family History  Problem Relation Age of Onset   Diabetes Mother    Hypertension Mother    Sleep apnea Mother    CAD Father    Hypertension Father    Sleep apnea Father    Breast cancer Sister        21s   Sleep apnea Sister    Hypertension Brother    Colon cancer Neg Hx    Colon polyps Neg Hx    Esophageal cancer Neg Hx    Stomach cancer Neg Hx    Rectal cancer Neg Hx      Current Outpatient Medications:    albuterol  (VENTOLIN  HFA) 108 (90 Base) MCG/ACT inhaler, Inhale 2 puffs into the lungs every 6 (six) hours as needed for wheezing or shortness of breath., Disp: 8 g, Rfl: 2   buPROPion  (WELLBUTRIN  XL) 150 MG 24 hr tablet, TAKE 1 TABLET BY MOUTH EVERY DAY, Disp: 90 tablet, Rfl: 1   cyclobenzaprine  (FLEXERIL ) 5 MG tablet, TAKE 1 TABLET BY MOUTH AT BEDTIME AS NEEDED FOR MUSCLE SPASMS., Disp: 30 tablet, Rfl: 0   ibuprofen (ADVIL) 200 MG tablet, Take 1,000 mg by mouth every 8 (eight) hours as needed (for pain.). ,  Disp: , Rfl:    nystatin  (MYCOSTATIN ) 100000 UNIT/ML suspension, Take 5 mLs (500,000 Units total) by mouth 4 (four) times daily., Disp: 240 mL, Rfl: 0   nystatin  (MYCOSTATIN /NYSTOP ) powder, Apply 1 Application topically 3 (three) times daily., Disp: 60 g, Rfl: 3   ofloxacin  (OCUFLOX ) 0.3 % ophthalmic solution, Place 1 drop into the left eye 4 (four) times daily., Disp: 5 mL, Rfl: 0   triamcinolone  cream (KENALOG ) 0.1 %, Apply 1 application topically 2 (two) times daily., Disp: 30 g, Rfl: 0   zolpidem  (AMBIEN ) 10 MG tablet, Take 1 tablet (10 mg total) by mouth at bedtime as needed. for sleep, Disp: 30 tablet, Rfl: 0   zonisamide  (ZONEGRAN ) 50 MG capsule, TAKE 1 CAPSULE BY MOUTH TWICE A DAY, Disp: 180 capsule, Rfl: 0   predniSONE  (STERAPRED UNI-PAK 21 TAB) 10 MG (21) TBPK tablet, Take as directed, Disp: 21 tablet, Rfl: 0  Current Facility-Administered Medications:    ketorolac  (TORADOL ) injection 60 mg, 60 mg, Intramuscular, Once,    Review of Systems:  Negative unless indicated in HPI.   Physical Exam: Vitals:   10/23/24 0706  BP: 110/74  Weight: 284 lb 8 oz (129 kg)    Body mass index is 48.08 kg/m.    Impression and Plan:  Buttock pain  Yeast dermatitis -     Nystatin ; Apply 1 Application topically 3 (three) times daily.  Dispense: 60 g; Refill: 3  Acute bilateral low back pain with bilateral sciatica -     Ambulatory referral to Orthopedic Surgery -     Ketorolac  Tromethamine  -     predniSONE ; Take as directed  Dispense: 21 tablet; Refill: 0   - For her presumptive sciatica/buttock pain I will give a prednisone  taper and IM Toradol  in office today.  She is requesting a referral to orthopedics, Dr. Yvone, I will place. - She has yeast dermatitis under her breasts.  Have prescribed nystatin  powder.  Time spent:30 minutes reviewing chart, interviewing and examining patient and formulating plan of care.     Tully Theophilus Andrews, MD [facility]

## 2024-10-28 ENCOUNTER — Encounter: Payer: Self-pay | Admitting: Adult Health

## 2024-11-01 ENCOUNTER — Other Ambulatory Visit: Payer: Self-pay

## 2024-11-01 ENCOUNTER — Encounter (HOSPITAL_COMMUNITY): Payer: Self-pay

## 2024-11-01 ENCOUNTER — Emergency Department (HOSPITAL_COMMUNITY)
Admission: EM | Admit: 2024-11-01 | Discharge: 2024-11-02 | Disposition: A | Discharge status: Home / Self Care | Attending: Emergency Medicine | Admitting: Emergency Medicine

## 2024-11-01 DIAGNOSIS — R21 Rash and other nonspecific skin eruption: Secondary | ICD-10-CM | POA: Insufficient documentation

## 2024-11-01 DIAGNOSIS — Z79899 Other long term (current) drug therapy: Secondary | ICD-10-CM | POA: Insufficient documentation

## 2024-11-01 MED ORDER — PREDNISONE 20 MG PO TABS
60.0000 mg | ORAL_TABLET | Freq: Once | ORAL | Status: AC
  Administered 2024-11-01: 60 mg via ORAL
  Filled 2024-11-01: qty 3

## 2024-11-01 MED ORDER — FAMOTIDINE 20 MG PO TABS: 20.0000 mg | ORAL_TABLET | Freq: Every day | ORAL | 0 refills | Status: AC

## 2024-11-01 MED ORDER — FAMOTIDINE 20 MG PO TABS
20.0000 mg | ORAL_TABLET | Freq: Once | ORAL | Status: AC
  Administered 2024-11-01: 20 mg via ORAL
  Filled 2024-11-01: qty 1

## 2024-11-01 MED ORDER — PREDNISONE 20 MG PO TABS: ORAL_TABLET | ORAL | 0 refills | Status: AC

## 2024-11-01 MED ORDER — DIPHENHYDRAMINE HCL 25 MG PO CAPS
25.0000 mg | ORAL_CAPSULE | Freq: Once | ORAL | Status: AC
  Administered 2024-11-01: 25 mg via ORAL
  Filled 2024-11-01: qty 1

## 2024-11-01 MED ORDER — DIPHENHYDRAMINE HCL 25 MG PO TABS: 25.0000 mg | ORAL_TABLET | Freq: Four times a day (QID) | ORAL | 0 refills | Status: AC

## 2024-11-01 NOTE — ED Triage Notes (Signed)
 Pt states she started having a rash that was itchy on the inside of both her arms last night, she mentions it has been very itchy and she has been scratching it and every time she does she feels like it spreads. She has no other rashes or hives on her body other than in the inside of her arms. There is no lip swelling and airway is clear with no swelling noted at this time. She mentions no new exposures to environmental or chemical factors. She expresses some shortness of breath .

## 2024-11-01 NOTE — Discharge Instructions (Signed)
 Take the prescribed medication as directed. Follow-up with your doctor if symptoms persist. Return to the ED for new or worsening symptoms.

## 2024-11-01 NOTE — ED Provider Notes (Signed)
 [place] EMERGENCY DEPARTMENT AT [facility] Provider Note   CSN: [id number] Arrival date & time: 11/01/24  2216     Patient presents with: Rash   Jill Davis is a 58 y.o. female.  {Add pertinent medical, surgical, social history, OB history to YEP:67052} The history is provided by the patient and medical records.  Rash  58 year old female with history of obesity, anxiety, substance abuse, depression, anemia, GERD, thyroid  disease, presenting to the ED for rash.  Patient states the rash started on left arm yesterday, now seems to spread to her right arm.  States she is unclear exactly what this is coming from.  She denies any new soaps, detergents, or other personal care products.  No new foods or medications.  She is not having any lip or tongue swelling, still able to eat and drink as normal.  She has allergy to Ivory soap but has not been anywhere near Ivory soap recently.  She did try using some over-the-counter Allegra and some topical itch spray without much relief.  Prior to Admission medications  Medication Sig Start Date End Date Taking? Authorizing Provider  albuterol  (VENTOLIN  HFA) 108 (90 Base) MCG/ACT inhaler Inhale 2 puffs into the lungs every 6 (six) hours as needed for wheezing or shortness of breath. 10/02/22   Theophilus Andrews, Tully GRADE, MD  buPROPion  (WELLBUTRIN  XL) 150 MG 24 hr tablet TAKE 1 TABLET BY MOUTH EVERY DAY 07/24/24   Theophilus Andrews, Tully GRADE, MD  cyclobenzaprine  (FLEXERIL ) 5 MG tablet TAKE 1 TABLET BY MOUTH AT BEDTIME AS NEEDED FOR MUSCLE SPASMS. 10/20/24   Theophilus Andrews, Tully GRADE, MD  ibuprofen (ADVIL) 200 MG tablet Take 1,000 mg by mouth every 8 (eight) hours as needed (for pain.).     [provider]  nystatin  (MYCOSTATIN ) 100000 UNIT/ML suspension Take 5 mLs (500,000 Units total) by mouth 4 (four) times daily. 09/10/24   Theophilus Andrews, Tully GRADE, MD  nystatin  (MYCOSTATIN /NYSTOP ) powder Apply 1 Application topically 3  (three) times daily. 10/23/24   Theophilus Andrews, Tully GRADE, MD  ofloxacin  (OCUFLOX ) 0.3 % ophthalmic solution Place 1 drop into the left eye 4 (four) times daily. 07/23/24   Theophilus Andrews, Tully GRADE, MD  predniSONE  (STERAPRED UNI-PAK 21 TAB) 10 MG (21) TBPK tablet Take as directed 10/23/24   Theophilus Andrews, Tully GRADE, MD  triamcinolone  cream (KENALOG ) 0.1 % Apply 1 application topically 2 (two) times daily. 05/12/21   Theophilus Andrews, Tully GRADE, MD  zolpidem  (AMBIEN ) 10 MG tablet Take 1 tablet (10 mg total) by mouth at bedtime as needed. for sleep 10/13/24   Theophilus Andrews, Tully GRADE, MD  zonisamide  (ZONEGRAN ) 50 MG capsule TAKE 1 CAPSULE BY MOUTH TWICE A DAY 07/29/24   Buck Saucer, MD    Allergies: Celebrex [celecoxib] and Dicyclomine     Review of Systems  Skin:  Positive for rash.  All other systems reviewed and are negative.   Updated Vital Signs BP (!) 129/94 (BP Location: Right Arm)   Pulse 89   Temp 97.7 F (36.5 C) (Oral)   Resp 18   SpO2 100%   Physical Exam Vitals and nursing note reviewed.  Constitutional:      Appearance: She is well-developed.  HENT:     Head: Normocephalic and atraumatic.     Mouth/Throat:     Comments: No lip/tongue swelling, handling secretions, no stridor Eyes:     Conjunctiva/sclera: Conjunctivae normal.     Pupils: Pupils are equal, round, and reactive to light.  Cardiovascular:  Rate and Rhythm: Normal rate and regular rhythm.     Heart sounds: Normal heart sounds.  Pulmonary:     Effort: Pulmonary effort is normal.     Breath sounds: Normal breath sounds.  Abdominal:     General: Bowel sounds are normal.     Palpations: Abdomen is soft.  Musculoskeletal:        General: Normal range of motion.     Cervical back: Normal range of motion.  Skin:    General: Skin is warm and dry.     Comments: Rash noted to BUE, sparing palms, signs of excoriation as she is continuously scratching even during exam but no discrete lesions or  ulcerations, no signs of superimposed infection  Neurological:     Mental Status: She is alert and oriented to person, place, and time.     (all labs ordered are listed, but only abnormal results are displayed) Labs Reviewed - No data to display  EKG: None  Radiology: No results found.  {Document cardiac monitor, telemetry assessment procedure when appropriate:32947} Procedures   Medications Ordered in the ED  predniSONE  (DELTASONE ) tablet 60 mg (has no administration in time range)  diphenhydrAMINE  (BENADRYL ) capsule 25 mg (has no administration in time range)  famotidine  (PEPCID ) tablet 20 mg (has no administration in time range)      {Click here for ABCD2, HEART and other calculators REFRESH Note before signing:1}                              Medical Decision Making Risk Prescription drug management.   ***  {Document critical care time when appropriate  Document review of labs and clinical decision tools ie CHADS2VASC2, etc  Document your independent review of radiology images and any outside records  Document your discussion with family members, caretakers and with consultants  Document social determinants of health affecting pt's care  Document your decision making why or why not admission, treatments were needed:32947:::1}   Final diagnoses:  None    ED Discharge Orders     None

## 2024-11-10 ENCOUNTER — Other Ambulatory Visit: Payer: Self-pay | Admitting: Internal Medicine

## 2024-11-10 DIAGNOSIS — G4700 Insomnia, unspecified: Secondary | ICD-10-CM

## 2024-11-10 MED ORDER — ZOLPIDEM TARTRATE 10 MG PO TABS: 10.0000 mg | ORAL_TABLET | Freq: Every evening | ORAL | 0 refills | Status: DC | PRN

## 2024-11-16 ENCOUNTER — Other Ambulatory Visit: Payer: Self-pay | Admitting: Internal Medicine

## 2024-11-16 DIAGNOSIS — M62838 Other muscle spasm: Secondary | ICD-10-CM

## 2024-11-24 ENCOUNTER — Ambulatory Visit: Admitting: Internal Medicine

## 2024-11-24 ENCOUNTER — Encounter: Payer: Self-pay | Admitting: Internal Medicine

## 2024-11-24 VITALS — BP 110/78 | Temp 97.4°F | Wt 285.3 lb

## 2024-11-24 DIAGNOSIS — G4700 Insomnia, unspecified: Secondary | ICD-10-CM | POA: Diagnosis not present

## 2024-11-24 DIAGNOSIS — L309 Dermatitis, unspecified: Secondary | ICD-10-CM | POA: Diagnosis not present

## 2024-11-24 MED ORDER — ZOLPIDEM TARTRATE ER 12.5 MG PO TBCR: 12.5000 mg | EXTENDED_RELEASE_TABLET | Freq: Every evening | ORAL | 2 refills | Status: AC | PRN

## 2024-11-24 MED ORDER — TRIAMCINOLONE ACETONIDE 0.1 % EX CREA: 1.0000 | TOPICAL_CREAM | Freq: Two times a day (BID) | CUTANEOUS | 0 refills | Status: AC

## 2024-11-24 NOTE — Progress Notes (Signed)
 "    Established Patient Office Visit     CC/Reason for Visit: Discuss acute concerns  HPI: Jill Davis is a 59 y.o. female who is coming in today for the above mentioned reasons.  Here today to discuss 2 main issues:  1.  Has had a pruritic rash around the elbow up both arms right worse than left.  2.  Is wanting about increasing her Ambien  dosing.   Past Medical/Surgical History: Past Medical History:  Diagnosis Date   Allergy    grass    Anemia    Anxiety    Arthritis    knees x 2   and hip on left    Asthma    none since moved to [place]    Depression    GERD (gastroesophageal reflux disease)    PRn Rolaids -  gets with red sauces etc    Heart murmur    as baby    History of substance abuse (HCC)    Insomnia    Morbid obesity (HCC)    Neuromuscular disorder (HCC)    plantar fascitis    Substance abuse (HCC)    Thyroid  disease    currently no meds     Past Surgical History:  Procedure Laterality Date   ABDOMINAL HYSTERECTOMY     BREAST BIOPSY Left 11/21/2015   benign   COLONOSCOPY WITH PROPOFOL  N/A 10/05/2020   Procedure: COLONOSCOPY WITH PROPOFOL ;  Surgeon: Eda Iha, MD;  Location: [facility];  Service: Gastroenterology;  Laterality: N/A;   cyst on buttocks      PARTIAL HYSTERECTOMY     prior to abd full hysterectomy   POLYPECTOMY  10/05/2020   Procedure: POLYPECTOMY;  Surgeon: Eda Iha, MD;  Location: [facility];  Service: Gastroenterology;;    Social History:  reports that she has quit smoking. She has never used smokeless tobacco. She reports that she does not currently use alcohol. She reports that she does not currently use drugs.  Allergies: Allergies[1]  Family History:  Family History  Problem Relation Age of Onset   Diabetes Mother    Hypertension Mother    Sleep apnea Mother    CAD Father    Hypertension Father    Sleep apnea Father    Breast cancer Sister        58s   Sleep apnea Sister     Hypertension Brother    Colon cancer Neg Hx    Colon polyps Neg Hx    Esophageal cancer Neg Hx    Stomach cancer Neg Hx    Rectal cancer Neg Hx     Current Medications[2]  Review of Systems:  Negative unless indicated in HPI.   Physical Exam: Vitals:   11/24/24 0840  BP: 110/78  Temp: (!) 97.4 F (36.3 C)  TempSrc: Oral  Weight: 285 lb 4.8 oz (129.4 kg)    Body mass index is 48.22 kg/m.    Impression and Plan:  Eczema, unspecified type -     Triamcinolone  Acetonide; Apply 1 Application topically 2 (two) times daily.  Dispense: 453 g; Refill: 0  Insomnia, unspecified type -     Zolpidem  Tartrate ER; Take 1 tablet (12.5 mg total) by mouth at bedtime as needed for sleep.  Dispense: 30 tablet; Refill: 2   - Appears to have eczema.  Advised to apply triamcinolone  cream followed by heavy healing ointment such as CeraVe or Aquaphor.  To apply daily at nighttime. - Increase Ambien  to 12.5 mg at bedtime.  Time spent:32 minutes reviewing chart, interviewing and examining patient and formulating plan of care.     Tully Theophilus Andrews, MD [facility]     [1]  Allergies Allergen Reactions   Celebrex [Celecoxib] Itching   Dicyclomine  Rash  [2]  Current Outpatient Medications:    albuterol  (VENTOLIN  HFA) 108 (90 Base) MCG/ACT inhaler, Inhale 2 puffs into the lungs every 6 (six) hours as needed for wheezing or shortness of breath., Disp: 8 g, Rfl: 2   buPROPion  (WELLBUTRIN  XL) 150 MG 24 hr tablet, TAKE 1 TABLET BY MOUTH EVERY DAY, Disp: 90 tablet, Rfl: 1   cyclobenzaprine  (FLEXERIL ) 5 MG tablet, TAKE 1 TABLET BY MOUTH AT BEDTIME AS NEEDED FOR MUSCLE SPASMS., Disp: 30 tablet, Rfl: 0   diphenhydrAMINE  (BENADRYL ) 25 MG tablet, Take 1 tablet (25 mg total) by mouth every 6 (six) hours., Disp: 20 tablet, Rfl: 0   famotidine  (PEPCID ) 20 MG tablet, Take 1 tablet (20 mg total) by mouth daily., Disp: 30 tablet, Rfl: 0   ibuprofen (ADVIL) 200 MG tablet,  Take 1,000 mg by mouth every 8 (eight) hours as needed (for pain.). , Disp: , Rfl:    nystatin  (MYCOSTATIN ) 100000 UNIT/ML suspension, Take 5 mLs (500,000 Units total) by mouth 4 (four) times daily., Disp: 240 mL, Rfl: 0   nystatin  (MYCOSTATIN /NYSTOP ) powder, Apply 1 Application topically 3 (three) times daily., Disp: 60 g, Rfl: 3   ofloxacin  (OCUFLOX ) 0.3 % ophthalmic solution, Place 1 drop into the left eye 4 (four) times daily., Disp: 5 mL, Rfl: 0   predniSONE  (DELTASONE ) 20 MG tablet, Take 40 mg by mouth daily for 3 days, then 20mg  by mouth daily for 3 days, then 10mg  daily for 3 days, Disp: 12 tablet, Rfl: 0   triamcinolone  cream (KENALOG ) 0.1 %, Apply 1 Application topically 2 (two) times daily., Disp: 453 g, Rfl: 0   zolpidem  (AMBIEN  CR) 12.5 MG CR tablet, Take 1 tablet (12.5 mg total) by mouth at bedtime as needed for sleep., Disp: 30 tablet, Rfl: 2   zonisamide  (ZONEGRAN ) 50 MG capsule, TAKE 1 CAPSULE BY MOUTH TWICE A DAY, Disp: 180 capsule, Rfl: 2  "

## 2024-11-25 ENCOUNTER — Encounter: Payer: Self-pay | Admitting: Internal Medicine

## 2024-11-25 DIAGNOSIS — M62838 Other muscle spasm: Secondary | ICD-10-CM

## 2024-11-26 MED ORDER — CYCLOBENZAPRINE HCL 5 MG PO TABS: 5.0000 mg | ORAL_TABLET | Freq: Every day | ORAL | 2 refills | Status: AC

## 2025-04-14 ENCOUNTER — Ambulatory Visit: Admitting: Adult Health
